# Patient Record
Sex: Female | Born: 1995 | Race: Black or African American | Hispanic: No | Marital: Single | Smoking: Former smoker
Health system: Southern US, Community
[De-identification: ages and names within clinical notes are randomized; demographics above are authoritative.]

## PROBLEM LIST (undated history)

## (undated) DIAGNOSIS — F909 Attention-deficit hyperactivity disorder, unspecified type: Secondary | ICD-10-CM

## (undated) DIAGNOSIS — F32A Depression, unspecified: Secondary | ICD-10-CM

## (undated) DIAGNOSIS — F329 Major depressive disorder, single episode, unspecified: Secondary | ICD-10-CM

## (undated) DIAGNOSIS — D649 Anemia, unspecified: Secondary | ICD-10-CM

## (undated) HISTORY — PX: WISDOM TOOTH EXTRACTION: SHX21

## (undated) HISTORY — PX: CHEST TUBE INSERTION: SHX231

## (undated) HISTORY — PX: HIP SURGERY: SHX245

---
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
## 2018-07-21 ENCOUNTER — Emergency Department (HOSPITAL_COMMUNITY)
Admission: EM | Admit: 2018-07-21 | Discharge: 2018-07-22 | Disposition: A | Discharge status: Home / Self Care | Payer: Self-pay | Attending: Emergency Medicine | Admitting: Emergency Medicine

## 2018-07-21 ENCOUNTER — Other Ambulatory Visit: Payer: Self-pay

## 2018-07-21 ENCOUNTER — Encounter (HOSPITAL_COMMUNITY): Payer: Self-pay | Admitting: Emergency Medicine

## 2018-07-21 DIAGNOSIS — Z79899 Other long term (current) drug therapy: Secondary | ICD-10-CM | POA: Insufficient documentation

## 2018-07-21 DIAGNOSIS — N939 Abnormal uterine and vaginal bleeding, unspecified: Secondary | ICD-10-CM | POA: Insufficient documentation

## 2018-07-21 DIAGNOSIS — B9689 Other specified bacterial agents as the cause of diseases classified elsewhere: Secondary | ICD-10-CM

## 2018-07-21 DIAGNOSIS — N760 Acute vaginitis: Secondary | ICD-10-CM | POA: Insufficient documentation

## 2018-07-21 LAB — BASIC METABOLIC PANEL
Anion gap: 9 (ref 5–15)
BUN: 12 mg/dL (ref 6–20)
CO2: 23 mmol/L (ref 22–32)
Calcium: 9.3 mg/dL (ref 8.9–10.3)
Chloride: 107 mmol/L (ref 98–111)
Creatinine, Ser: 0.83 mg/dL (ref 0.44–1.00)
GFR calc Af Amer: >60 mL/min (ref >60)
GFR calc non Af Amer: >60 mL/min (ref >60)
Glucose, Bld: 83 mg/dL (ref 70–99)
Potassium: 3.5 mmol/L (ref 3.5–5.1)
Sodium: 139 mmol/L (ref 135–145)

## 2018-07-21 LAB — CBC
HCT: 36.7 % (ref 36.0–46.0)
Hemoglobin: 11.4 g/dL — ABNORMAL LOW (ref 12.0–15.0)
MCH: 28 pg (ref 26.0–34.0)
MCHC: 31.1 g/dL (ref 30.0–36.0)
MCV: 90.2 fL (ref 80.0–100.0)
Platelets: 280 10*3/uL (ref 150–400)
RBC: 4.07 MIL/uL (ref 3.87–5.11)
RDW: 13.8 % (ref 11.5–15.5)
WBC: 10.8 10*3/uL — ABNORMAL HIGH (ref 4.0–10.5)
nRBC: 0 % (ref 0.0–0.2)

## 2018-07-21 LAB — WET PREP, GENITAL
Sperm: NONE SEEN
Trich, Wet Prep: NONE SEEN
Yeast Wet Prep HPF POC: NONE SEEN

## 2018-07-21 LAB — I-STAT BETA HCG BLOOD, ED (MC, WL, AP ONLY): I-stat hCG, quantitative: <5 m[IU]/mL (ref <5)

## 2018-07-21 NOTE — ED Triage Notes (Signed)
Pt reports she needs a pregnancy test, although she had a negative one at home.  Upon further inquiry she reports "pregnancy symptoms for two weeks" however has been bleeding for two weeks.

## 2018-07-21 NOTE — ED Provider Notes (Signed)
[facility] EMERGENCY DEPARTMENT Provider Note   CSN: [id number] Arrival date & time: 07/21/18  2017    History   Chief Complaint Chief Complaint  Patient presents with  . Vaginal Bleeding    HPI Wendy Smith is a 23 y.o. female.     HPI  Patient is a 23 year old female with past medical history of ADHD, depression, presenting for vaginal bleeding.  Patient reports that she has had a prolonged course of her menstrual period for approximately 1.5 weeks.  She reports that she is continued to have clots of blood past each day.  Patient reports that in the past when she was on Depo-Provera she spotted for long periods of time but did not have heavy bleeding.  She is no longer on any form of hormonal contraception.  Patient denies any dizziness, lightheadedness, presyncope, chest pain, or shortness of breath.  Patient reports mild pelvic cramping, but no significant pain.  Patient reports that there is a chance she could be pregnant, although did have a negative urine pregnancy test at home.  She has not had any missed menses. She denies any vaginal discharge.  Reports she is sexually active with one female partner, but denies STI concerns.  Past Medical History:  Diagnosis Date  . ADHD (attention deficit hyperactivity disorder)   . Depression     There are no active problems to display for this patient.   Past Surgical History:  Procedure Laterality Date  . WISDOM TOOTH EXTRACTION       OB History   No obstetric history on file.      Home Medications    Prior to Admission medications   Medication Sig Start Date End Date Taking? Authorizing Provider  amoxicillin (AMOXIL) 500 MG capsule Take 500 mg by mouth 3 (three) times daily.    [provider]  doxycycline (VIBRAMYCIN) 100 MG capsule Take 1 capsule (100 mg total) by mouth 2 (two) times daily. X 7 days 09/12/14   Tobey Grim, MD  FLUoxetine (PROZAC) 20 MG tablet Take 20 mg by mouth daily.     [provider]  lisdexamfetamine (VYVANSE) 30 MG capsule Take 30 mg by mouth daily.    [provider]  trimethoprim-polymyxin b (POLYTRIM) ophthalmic solution Place 1 drop into the right eye every 4 (four) hours. X 7 days 09/24/13   Presson, Mathis Fare, PA    Family History No family history on file.  Social History Social History   Tobacco Use  . Smoking status: Never Smoker  . Smokeless tobacco: Never Used  Substance Use Topics  . Alcohol use: No  . Drug use: No     Allergies   Patient has no known allergies.   Review of Systems Review of Systems  Constitutional: Negative for chills, fatigue and fever.  Respiratory: Negative for shortness of breath.   Cardiovascular: Negative for chest pain.  Gastrointestinal: Negative for abdominal pain, nausea and vomiting.  Genitourinary: Positive for menstrual problem and vaginal bleeding. Negative for flank pain, pelvic pain, vaginal discharge and vaginal pain.  Neurological: Negative for weakness and light-headedness.  All other systems reviewed and are negative.    Physical Exam Updated Vital Signs BP 114/67 (BP Location: Right Arm)   Pulse (!) 107   Temp 98.4 F (36.9 C) (Oral)   Resp 18   SpO2 95%   Physical Exam Vitals signs and nursing note reviewed.  Constitutional:      General: She is not in  acute distress.    Appearance: She is well-developed.  HENT:     Head: Normocephalic and atraumatic.     Mouth/Throat:     Mouth: Mucous membranes are moist.  Eyes:     Conjunctiva/sclera: Conjunctivae normal.     Pupils: Pupils are equal, round, and reactive to light.     Comments: No conjunctival pallor.  Neck:     Musculoskeletal: Normal range of motion and neck supple.  Cardiovascular:     Rate and Rhythm: Normal rate and regular rhythm.     Heart sounds: S1 normal and S2 normal. No murmur.  Pulmonary:     Effort: Pulmonary effort is normal.     Breath sounds: Normal breath sounds. No  wheezing or rales.  Abdominal:     General: There is no distension.     Palpations: Abdomen is soft.     Tenderness: There is no abdominal tenderness. There is no guarding.  Genitourinary:    Comments: Pelvic examination performed with RN chaperone present.  Patient has no lymphadenopathy of the inguinal region.  Vaginal tissue pink and rugated.  Cervix nonerythematous and nonfriable.  Cervix is closed. There is a clot of blood around the cervix, but no sources of active bleeding. Minimal discharge on exam. On bimanual exam, patient exhibits no cervical motion tenderness, tenderness of uterine fundus nor adnexal tenderness. Musculoskeletal: Normal range of motion.        General: No deformity.  Lymphadenopathy:     Cervical: No cervical adenopathy.  Skin:    General: Skin is warm and dry.     Findings: No erythema or rash.  Neurological:     Mental Status: She is alert.     Comments: Cranial nerves grossly intact. Patient moves extremities symmetrically and with good coordination.  Psychiatric:        Behavior: Behavior normal.        Thought Content: Thought content normal.        Judgment: Judgment normal.      ED Treatments / Results  Labs (all labs ordered are listed, but only abnormal results are displayed) Labs Reviewed  WET PREP, GENITAL - Abnormal; Notable for the following components:      Result Value   Clue Cells Wet Prep HPF POC PRESENT (*)    WBC, Wet Prep HPF POC MANY (*)    All other components within normal limits  CBC - Abnormal; Notable for the following components:   WBC 10.8 (*)    Hemoglobin 11.4 (*)    All other components within normal limits  URINALYSIS, ROUTINE W REFLEX MICROSCOPIC - Abnormal; Notable for the following components:   APPearance HAZY (*)    Hgb urine dipstick MODERATE (*)    Ketones, ur 5 (*)    Protein, ur 100 (*)    All other components within normal limits  BASIC METABOLIC PANEL  RPR  HIV ANTIBODY (ROUTINE TESTING W REFLEX)   I-STAT BETA HCG BLOOD, ED ([facility], [facility], [facility] ONLY)  GC/CHLAMYDIA PROBE AMP ([place]) NOT AT [facility]    EKG None  Radiology No results found.  Procedures Procedures (including critical care time)  Medications Ordered in ED Medications - No data to display   Initial Impression / Assessment and Plan / ED Course  I have reviewed the triage vital signs and the nursing notes.  Pertinent labs & imaging results that were available during my care of the patient were reviewed by me and considered in my medical decision making (  see chart for details).  Clinical Course as of Jul 22 11  Sun Jul 21, 2018  2331 Not significantly changed from prior.   Hemoglobin(!): 11.4 [AM]    Clinical Course User Index [AM] Elisha PonderMurray, Madell Heino B, PA-C       Patient is nontoxic-appearing and hemodynamically stable.  Patient without any rapid vaginal bleeding with concerns for acute blood loss anemia.  Do not suspect spontaneous abortion, as patient has close cervix.  She has not missed any menses.Source appears to be vaginal and not urinary.  Work-up demonstrating stable hemoglobin 11.4.  Wet prep demonstrating clue cells many WBCs, however this does not correlate with significant vaginal discharge. Will treat for BV with flagyl alone.    Given stable hemoglobin and examination without significant or rapid bleeding, will [facility] to [facility].  Will refrain from starting Megace, TXA, or other medical therapy to stop the bleeding.  Discussed structural and functional causes of abnormal uterine bleeding with the patient.  Recommended she follow-up with OB/GYN.  Patient was given return precautions for any sudden increase in bleeding or any signs or symptoms of acute blood loss anemia such as dizziness, lightheadedness, syncope or presyncope.  Patient is in understanding and agrees with plan of care.  Final Clinical Impressions(s) / ED Diagnoses   Final diagnoses:  Abnormal uterine  bleeding  BV (bacterial vaginosis)    ED Discharge Orders         Ordered    metroNIDAZOLE (FLAGYL) 500 MG tablet  2 times daily     07/22/18 0102           Delia ChimesMurray, Graeson Nouri B, PA-C 07/22/18 0117    Cathren LaineSteinl, Kevin, MD 07/22/18 1538

## 2018-07-22 LAB — URINALYSIS, ROUTINE W REFLEX MICROSCOPIC
Bacteria, UA: NONE SEEN
Bilirubin Urine: NEGATIVE
Glucose, UA: NEGATIVE mg/dL
Ketones, ur: 5 mg/dL — AB
Leukocytes,Ua: NEGATIVE
Nitrite: NEGATIVE
Protein, ur: 100 mg/dL — AB
Specific Gravity, Urine: 1.029 (ref 1.005–1.030)
pH: 5 (ref 5.0–8.0)

## 2018-07-22 LAB — GC/CHLAMYDIA PROBE AMP (~~LOC~~) NOT AT ARMC
Chlamydia: NEGATIVE
Neisseria Gonorrhea: NEGATIVE

## 2018-07-22 LAB — RPR: RPR Ser Ql: NONREACTIVE

## 2018-07-22 LAB — HIV ANTIBODY (ROUTINE TESTING W REFLEX): HIV Screen 4th Generation wRfx: NONREACTIVE

## 2018-07-22 MED ORDER — METRONIDAZOLE 500 MG PO TABS: 500.0000 mg | ORAL_TABLET | Freq: Two times a day (BID) | ORAL | 0 refills | Status: DC

## 2018-07-22 NOTE — ED Notes (Signed)
Patient verbalizes understanding of discharge instructions. Opportunity for questioning and answers were provided. Armband removed by staff, pt discharged from ED.  

## 2018-07-22 NOTE — Discharge Instructions (Signed)
Please see the information and instructions below regarding your visit.  Your diagnoses today include:  1. Abnormal uterine bleeding    There are many causes of abnormal uterine bleeding.  They are classified as either structural or functional causes of bleeding.  Tests performed today include: See side panel of your discharge paperwork for testing performed today. Vital signs are listed at the bottom of these instructions.   Pregnancy test is negative today.  Your hemoglobin is also stable.  We tested for gonorrhea, chlamydia, HIV and syphilis today.  These results will be available in 48 to 72 hours.  You can find them on your MyChart, or you will receive a call for any positive results.  You will not receive a call for any negative results.   Medications prescribed:    Take any prescribed medications only as prescribed, and any over the counter medications only as directed on the packaging.  You may take naproxen, 275 mg every 12 hours as needed for abdominal cramping.  Flagyl is an antibiotic used to treat Bacterial vaginosis. This medication CANNOT be taken with alcohol, because it can cause nausea and vomiting combined with alcohol. Please also refrain from drinking alcohol for 48 hours after you finish the medicine.  Home care instructions:  Please follow any educational materials contained in this packet.   Follow-up instructions: Please [facility] with [facility] from [facility].  Return instructions:  Please return to the Emergency Department if you experience worsening symptoms.  Please return the emergency department if you have a sudden increase in bleeding where you are soaking through pads or tampons much faster, you experience any signs or symptoms of acute blood loss such as dizziness, lightheadedness, feeling sweaty, having chest pain or shortness of breath, or feel very weak. Please return if you have any other emergent  concerns.  Additional Information:   Your vital signs today were: BP 105/65 (BP Location: Right Arm)    Pulse 71    Temp 98.4 F (36.9 C) (Oral)    Resp 18    SpO2 100%  If your blood pressure (BP) was elevated on multiple readings during this visit above 130 for the top number or above 80 for the bottom number, please have this repeated by your primary care provider within one month. --------------  Thank you for allowing [place] to participate in your care today.

## 2018-12-09 DIAGNOSIS — S32402A Unspecified fracture of left acetabulum, initial encounter for closed fracture: Secondary | ICD-10-CM | POA: Insufficient documentation

## 2019-06-24 ENCOUNTER — Other Ambulatory Visit: Payer: Self-pay

## 2019-06-24 ENCOUNTER — Emergency Department (HOSPITAL_COMMUNITY)
Admission: EM | Admit: 2019-06-24 | Discharge: 2019-06-24 | Disposition: A | Discharge status: Home / Self Care | Payer: Self-pay | Attending: Emergency Medicine | Admitting: Emergency Medicine

## 2019-06-24 DIAGNOSIS — X58XXXA Exposure to other specified factors, initial encounter: Secondary | ICD-10-CM | POA: Insufficient documentation

## 2019-06-24 DIAGNOSIS — Y999 Unspecified external cause status: Secondary | ICD-10-CM | POA: Insufficient documentation

## 2019-06-24 DIAGNOSIS — Y929 Unspecified place or not applicable: Secondary | ICD-10-CM | POA: Insufficient documentation

## 2019-06-24 DIAGNOSIS — Z79899 Other long term (current) drug therapy: Secondary | ICD-10-CM | POA: Insufficient documentation

## 2019-06-24 DIAGNOSIS — Y939 Activity, unspecified: Secondary | ICD-10-CM | POA: Insufficient documentation

## 2019-06-24 DIAGNOSIS — S01512A Laceration without foreign body of oral cavity, initial encounter: Secondary | ICD-10-CM | POA: Insufficient documentation

## 2019-06-24 DIAGNOSIS — F909 Attention-deficit hyperactivity disorder, unspecified type: Secondary | ICD-10-CM | POA: Insufficient documentation

## 2019-06-24 DIAGNOSIS — S0993XA Unspecified injury of face, initial encounter: Secondary | ICD-10-CM

## 2019-06-24 MED ORDER — AMOXICILLIN-POT CLAVULANATE 875-125 MG PO TABS: 1.0000 | ORAL_TABLET | Freq: Two times a day (BID) | ORAL | 0 refills | Status: DC

## 2019-06-24 NOTE — ED Provider Notes (Signed)
[facility] EMERGENCY DEPARTMENT Provider Note   CSN: [id number] Arrival date & time: 06/24/19  1522     History No chief complaint on file.   Wendy Smith is a 24 y.o. female who presents to the ED today with complaint of bleeding to tongue that began around 10 AM this morning. Pt reports she believes she may have bitten her tongue. She also  Mentions that she had a new tongue piercing placed 2 days ago however denies bleeding from the piercing site itself. Denies fevers, chills, or any other associated symptoms.  Pt is not anticoagulated.   The history is provided by the patient and medical records.       Past Medical History:  Diagnosis Date  . ADHD (attention deficit hyperactivity disorder)   . Depression     There are no problems to display for this patient.   Past Surgical History:  Procedure Laterality Date  . WISDOM TOOTH EXTRACTION       OB History   No obstetric history on file.     No family history on file.  Social History   Tobacco Use  . Smoking status: Never Smoker  . Smokeless tobacco: Never Used  Substance Use Topics  . Alcohol use: No  . Drug use: No    Home Medications Prior to Admission medications   Medication Sig Start Date End Date Taking? Authorizing Provider  amoxicillin-clavulanate (AUGMENTIN) 875-125 MG tablet Take 1 tablet by mouth every 12 (twelve) hours. 06/24/19   Alroy Bailiff, Arohi Salvatierra, PA-C  FLUoxetine (PROZAC) 20 MG tablet Take 20 mg by mouth daily.    [provider]  lisdexamfetamine (VYVANSE) 30 MG capsule Take 30 mg by mouth daily.    [provider]  metroNIDAZOLE (FLAGYL) 500 MG tablet Take 1 tablet (500 mg total) by mouth 2 (two) times daily. 07/22/18   Langston Masker B, PA-C  trimethoprim-polymyxin b (POLYTRIM) ophthalmic solution Place 1 drop into the right eye every 4 (four) hours. X 7 days 09/24/13   Lutricia Feil, PA    Allergies    Patient has no known allergies.  Review of  Systems   Review of Systems  Constitutional: Negative for chills and fever.  HENT: Positive for mouth sores (bleeding from tongue).     Physical Exam Updated Vital Signs BP 135/71 (BP Location: Right Arm)   Pulse 99   Temp 98.3 F (36.8 C) (Oral)   Resp 16   LMP 04/24/2019 (Approximate)   SpO2 100%   Physical Exam Vitals and nursing note reviewed.  Constitutional:      Appearance: She is not ill-appearing or diaphoretic.  HENT:     Head: Normocephalic.     Comments: Tongue piercing removed from tongue. Pt has small area on left tip of tongue with missing tissue and small capillary that continues to bleed.     Right Ear: Tympanic membrane normal.     Left Ear: Tympanic membrane normal.  Eyes:     Conjunctiva/sclera: Conjunctivae normal.  Cardiovascular:     Rate and Rhythm: Normal rate and regular rhythm.     Pulses: Normal pulses.  Pulmonary:     Effort: Pulmonary effort is normal.     Breath sounds: Normal breath sounds. No wheezing, rhonchi or rales.  Skin:    General: Skin is warm and dry.     Coloration: Skin is not jaundiced.  Neurological:     Mental Status: She is alert.     ED  Results / Procedures / Treatments   Labs (all labs ordered are listed, but only abnormal results are displayed) Labs Reviewed - No data to display  EKG None  Radiology No results found.  Procedures Procedures (including critical care time)  Medications Ordered in ED Medications - No data to display  ED Course  I have reviewed the triage vital signs and the nursing notes.  Pertinent labs & imaging results that were available during my care of the patient were reviewed by me and considered in my medical decision making (see chart for details).    MDM Rules/Calculators/A&P                      24 year old female who presents to the ED today with bleeding from tongue.  Believes she may have bit it.  Did recently have a tongue piercing placed 2 days ago.  On arrival to the  ED patient is afebrile, nontachycardic and nontachypneic.  She appears to be in no acute distress.  On exam she has a small piece of missing tissue on the edge of her tongue on the left side consistent with likely bite.  She has small capillary that continues to bleed.  Will have patient hold pressure and continue to reevaluate.  Is not anticoagulated.  After holding pressure for approximately 40 minutes bleeding has ceased.  Patient to be discharged home at this time.  Will cover empirically with antibiotic coverage given patient likely bit part of her tongue causing the bleeding.  Patient to follow-up with her PCP.  Advised to return to the ED if bleeding recurs and she is unable to stop bleeding after direct pressure for greater than 30 minutes.  Patient is in agreement with plan and stable for discharge home.   This note was prepared using Dragon voice recognition software and may include unintentional dictation errors due to the inherent limitations of voice recognition software.  Final Clinical Impression(s) / ED Diagnoses Final diagnoses:  Injury of tongue, initial encounter    Rx / DC Orders ED Discharge Orders         Ordered    amoxicillin-clavulanate (AUGMENTIN) 875-125 MG tablet  Every 12 hours     06/24/19 1713           Discharge Instructions     Please pick up antibiotics and take as prescribed to cover for infection  If bleeding reoccurs please hold pressure for 30 minutes to stop the bleeding If you are unable to stop the bleeding again despite holding pressure for a prolonged period of time please return to the ED for reevaluation I would not recommend putting tongue piercing back in until this wound heals        Tanda Rockers, PA-C 06/24/19 1715    Eber Hong, MD 06/25/19 1204

## 2019-06-24 NOTE — Discharge Instructions (Signed)
Please pick up antibiotics and take as prescribed to cover for infection  If bleeding reoccurs please hold pressure for 30 minutes to stop the bleeding If you are unable to stop the bleeding again despite holding pressure for a prolonged period of time please return to the ED for reevaluation I would not recommend putting tongue piercing back in until this wound heals

## 2019-06-24 NOTE — ED Triage Notes (Signed)
Pt got her tongue pierced two days ago and woke up this morning with bleeding from site. Denies pain.

## 2019-06-24 NOTE — ED Notes (Signed)
Pt verbalizes understanding of d/c instructions. Prescriptions reviewed with patient. Pt ambulatory at d/c with all belongings.  

## 2019-06-24 NOTE — ED Notes (Signed)
ED Provider at bedside. 

## 2019-08-25 ENCOUNTER — Other Ambulatory Visit: Payer: Self-pay

## 2019-08-25 ENCOUNTER — Ambulatory Visit (HOSPITAL_COMMUNITY)
Admission: EM | Admit: 2019-08-25 | Discharge: 2019-08-25 | Disposition: A | Discharge status: Home / Self Care | Payer: Self-pay | Attending: Family Medicine | Admitting: Family Medicine

## 2019-08-25 ENCOUNTER — Encounter (HOSPITAL_COMMUNITY): Payer: Self-pay

## 2019-08-25 DIAGNOSIS — N912 Amenorrhea, unspecified: Secondary | ICD-10-CM

## 2019-08-25 DIAGNOSIS — F909 Attention-deficit hyperactivity disorder, unspecified type: Secondary | ICD-10-CM | POA: Insufficient documentation

## 2019-08-25 DIAGNOSIS — Z3201 Encounter for pregnancy test, result positive: Secondary | ICD-10-CM

## 2019-08-25 DIAGNOSIS — R350 Frequency of micturition: Secondary | ICD-10-CM

## 2019-08-25 DIAGNOSIS — Z79899 Other long term (current) drug therapy: Secondary | ICD-10-CM | POA: Insufficient documentation

## 2019-08-25 DIAGNOSIS — F329 Major depressive disorder, single episode, unspecified: Secondary | ICD-10-CM | POA: Insufficient documentation

## 2019-08-25 DIAGNOSIS — R3915 Urgency of urination: Secondary | ICD-10-CM

## 2019-08-25 LAB — POCT URINALYSIS DIP (DEVICE)
Glucose, UA: 100 mg/dL — AB
Hgb urine dipstick: NEGATIVE
Nitrite: NEGATIVE
Protein, ur: NEGATIVE mg/dL
Specific Gravity, Urine: >=1.03 (ref 1.005–1.030)
Urobilinogen, UA: 0.2 mg/dL (ref 0.0–1.0)
pH: 6 (ref 5.0–8.0)

## 2019-08-25 LAB — POC URINE PREG, ED: Preg Test, Ur: POSITIVE — AB

## 2019-08-25 NOTE — ED Provider Notes (Signed)
[facility]   CC: UTI  SUBJECTIVE:  Wendy Smith is a 24 y.o. female who complains of urinary frequency, urgency, mild nausea, low abd pain and low back pain. LMP 06/28/19. Patient denies a precipitating event, recent sexual encounter, excessive caffeine intake. Is sexually active with one female partner. Localizes the pain to the lower abdomen. Pain is intermittent and describes it as sharp. Has not tried any medications at home. Denies similar symptoms in the past.  Denies fever, chills, vomiting, flank pain, abnormal vaginal discharge or bleeding, hematuria.    ROS: As in HPI.  All other pertinent ROS negative.     Past Medical History:  Diagnosis Date  . ADHD (attention deficit hyperactivity disorder)   . Depression    Past Surgical History:  Procedure Laterality Date  . CHEST TUBE INSERTION    . HIP SURGERY Left   . WISDOM TOOTH EXTRACTION     No Known Allergies No current facility-administered medications on file prior to encounter.   Current Outpatient Medications on File Prior to Encounter  Medication Sig Dispense Refill  . amoxicillin-clavulanate (AUGMENTIN) 875-125 MG tablet Take 1 tablet by mouth every 12 (twelve) hours. 14 tablet 0  . FLUoxetine (PROZAC) 20 MG tablet Take 20 mg by mouth daily.    Marland Kitchen lisdexamfetamine (VYVANSE) 30 MG capsule Take 30 mg by mouth daily.    . metroNIDAZOLE (FLAGYL) 500 MG tablet Take 1 tablet (500 mg total) by mouth 2 (two) times daily. 14 tablet 0  . trimethoprim-polymyxin b (POLYTRIM) ophthalmic solution Place 1 drop into the right eye every 4 (four) hours. X 7 days 10 mL 0   Social History   Socioeconomic History  . Marital status: Single    Spouse name: Not on file  . Number of children: Not on file  . Years of education: Not on file  . Highest education level: Not on file  Occupational History  . Not on file  Tobacco Use  . Smoking status: Never Smoker  . Smokeless tobacco: Never Used  Substance and Sexual Activity  .  Alcohol use: No  . Drug use: No  . Sexual activity: Yes    Birth control/protection: Condom, Injection  Other Topics Concern  . Not on file  Social History Narrative  . Not on file   Social Determinants of Health   Financial Resource Strain:   . Difficulty of Paying Living Expenses:   Food Insecurity:   . Worried About Charity fundraiser in the Last Year:   . Arboriculturist in the Last Year:   Transportation Needs:   . Film/video editor (Medical):   Marland Kitchen Lack of Transportation (Non-Medical):   Physical Activity:   . Days of Exercise per Week:   . Minutes of Exercise per Session:   Stress:   . Feeling of Stress :   Social Connections:   . Frequency of Communication with Friends and Family:   . Frequency of Social Gatherings with Friends and Family:   . Attends Religious Services:   . Active Member of Clubs or Organizations:   . Attends Archivist Meetings:   Marland Kitchen Marital Status:   Intimate Partner Violence:   . Fear of Current or Ex-Partner:   . Emotionally Abused:   Marland Kitchen Physically Abused:   . Sexually Abused:    No family history on file.  OBJECTIVE:  Vitals:   08/25/19 1743 08/25/19 1745  BP: (!) 124/55   Pulse: 91  Resp: 16   Temp: 98.4 F (36.9 C)   TempSrc: Oral   SpO2: 100%   Weight:  172 lb (78 kg)  Height:  5\' 8"  (1.727 m)   General appearance: AOx3 in no acute distress HEENT: NCAT.  Oropharynx clear.  Lungs: clear to auscultation bilaterally without adventitious breath sounds Heart: regular rate and rhythm.  Radial pulses 2+ symmetrical bilaterally Abdomen: soft; non-distended; no tenderness; bowel sounds present; no guarding or rebound tenderness Back: no CVA tenderness Extremities: no edema; symmetrical with no gross deformities Skin: warm and dry Neurologic: Ambulates from chair to exam table without difficulty Psychological: alert and cooperative; normal mood and affect  Labs Reviewed  POCT URINALYSIS DIP (DEVICE) - Abnormal;  Notable for the following components:      Result Value   Glucose, UA 100 (*)    Bilirubin Urine SMALL (*)    Ketones, ur TRACE (*)    Leukocytes,Ua SMALL (*)    All other components within normal limits  POC URINE PREG, ED - Abnormal; Notable for the following components:   Preg Test, Ur POSITIVE (*)    All other components within normal limits  URINE CULTURE    ASSESSMENT & PLAN:  1. Positive pregnancy test   2. Amenorrhea   3. Urinary frequency   4. Urinary urgency     No orders of the defined types were placed in this encounter.  Positive Pregnancy Test Follow up for prenatal care with [facility]  Urinary Frequency Urine culture sent.  We will call you with abnormal results that need further treatment.   Push fluids and get plenty of rest.   Take antibiotic as directed and to completion Take pyridium as prescribed and as needed for symptomatic relief Follow up with PCP if symptoms persists Return here or go to ER if you have any new or worsening symptoms such as fever, worsening abdominal pain, nausea/vomiting, flank pain  Outlined signs and symptoms indicating need for more acute intervention. Patient verbalized understanding. After Visit Summary given.      [facility], NP 08/25/19 1847

## 2019-08-25 NOTE — Discharge Instructions (Signed)
You had a positive pregnancy test in the office today  You may have a urinary tract infection. We are going to culture your urine and will call you as soon as we have the results.   Drink plenty of water, 8-10 glasses per day.   Follow up with your primary care provider as needed.   Go to the Emergency Department if you experience severe pain, shortness of breath, high fever, or other concerns.

## 2019-08-25 NOTE — ED Triage Notes (Signed)
Wants pregnancy test. Pt c/o nausea, abdominal pain, lower back pain. LMP 06/28/2019

## 2019-08-27 LAB — URINE CULTURE

## 2019-09-01 ENCOUNTER — Ambulatory Visit (INDEPENDENT_AMBULATORY_CARE_PROVIDER_SITE_OTHER): Payer: Self-pay | Admitting: *Deleted

## 2019-09-01 DIAGNOSIS — Z3400 Encounter for supervision of normal first pregnancy, unspecified trimester: Secondary | ICD-10-CM

## 2019-09-01 DIAGNOSIS — Z3480 Encounter for supervision of other normal pregnancy, unspecified trimester: Secondary | ICD-10-CM | POA: Insufficient documentation

## 2019-09-01 NOTE — Progress Notes (Signed)
  Virtual Visit via Telephone Note  I connected with [name] on 09/01/19 at  2:20 PM EDT by telephone and verified that I am speaking with the correct person using two identifiers.  Location: [facility] Patient: Wendy Smith, MRN: [id number] Provider: Clovis Pu, RN   I discussed the limitations, risks, security and privacy concerns of performing an evaluation and management service by telephone and the availability of in person appointments. I also discussed with the patient that there may be a patient responsible charge related to this service. The patient expressed understanding and agreed to proceed.   History of Present Illness: PRENATAL INTAKE SUMMARY  Ms. Newborn presents today New OB Nurse Interview.  OB History    Gravida  1   Para      Term      Preterm      AB      Living        SAB      TAB      Ectopic      Multiple      Live Births             I have reviewed the patient's medical, obstetrical, social, and family histories, medications, and available lab results.  SUBJECTIVE She has no unusual complaints   Observations/Objective: Initial nurse interview for history/labs (New OB)  EDD: 04/05/20 by LMP GA: [redacted]w[redacted]d G1P0 FHT: non face to face interview  GENERAL APPEARANCE: non face to face interview  Assessment and Plan: Normal pregnancy Prenatal care-[facility] Lab work to completed at next visit with Judeth Horn, NP 09/24/2019 Sign up for Babyscipts and Mychart Continue PNV Waiting for medicaid to be active and will send Rx for Blood pressure monitor and weight scale  Follow Up Instructions:   I discussed the assessment and treatment plan with the patient. The patient was provided an opportunity to ask questions and all were answered. The patient agreed with the plan and demonstrated an understanding of the instructions.   The patient was advised to call back or seek an in-person evaluation if the symptoms worsen or if the  condition fails to improve as anticipated.  I provided 15 minutes of non-face-to-face time during this encounter.   Clovis Pu, RN

## 2019-09-24 ENCOUNTER — Encounter: Payer: Self-pay | Admitting: Student

## 2019-09-24 ENCOUNTER — Ambulatory Visit (INDEPENDENT_AMBULATORY_CARE_PROVIDER_SITE_OTHER): Payer: Self-pay | Admitting: Student

## 2019-09-24 ENCOUNTER — Other Ambulatory Visit: Payer: Self-pay

## 2019-09-24 ENCOUNTER — Other Ambulatory Visit (HOSPITAL_COMMUNITY)
Admission: RE | Admit: 2019-09-24 | Discharge: 2019-09-24 | Disposition: A | Discharge status: Home / Self Care | Payer: Medicaid Other | Source: Ambulatory Visit | Attending: Student | Admitting: Student

## 2019-09-24 ENCOUNTER — Encounter: Payer: Self-pay | Admitting: General Practice

## 2019-09-24 VITALS — BP 115/72 | HR 89 | Temp 98.3°F | Wt 155.4 lb

## 2019-09-24 DIAGNOSIS — Z3A12 12 weeks gestation of pregnancy: Secondary | ICD-10-CM

## 2019-09-24 DIAGNOSIS — Z3481 Encounter for supervision of other normal pregnancy, first trimester: Secondary | ICD-10-CM

## 2019-09-24 DIAGNOSIS — A5901 Trichomonal vulvovaginitis: Secondary | ICD-10-CM

## 2019-09-24 DIAGNOSIS — Z3400 Encounter for supervision of normal first pregnancy, unspecified trimester: Secondary | ICD-10-CM | POA: Diagnosis present

## 2019-09-24 NOTE — Patient Instructions (Addendum)
Constipation, Adult Constipation is when a person has fewer bowel movements in a week than normal, has difficulty having a bowel movement, or has stools that are dry, hard, or larger than normal. Constipation may be caused by an underlying condition. It may become worse with age if a person takes certain medicines and does not take in enough fluids. Follow these instructions at home: Eating and drinking   Eat foods that have a lot of fiber, such as fresh fruits and vegetables, whole grains, and beans.  Limit foods that are high in fat, low in fiber, or overly processed, such as french fries, hamburgers, cookies, candies, and soda.  Drink enough fluid to keep your urine clear or pale yellow. General instructions  Exercise regularly or as told by your health care provider.  Go to the restroom when you have the urge to go. Do not hold it in.  Take over-the-counter and prescription medicines only as told by your health care provider. These include any fiber supplements.  Practice pelvic floor retraining exercises, such as deep breathing while relaxing the lower abdomen and pelvic floor relaxation during bowel movements.  Watch your condition for any changes.  Keep all follow-up visits as told by your health care provider. This is important. Contact a health care provider if:  You have pain that gets worse.  You have a fever.  You do not have a bowel movement after 4 days.  You vomit.  You are not hungry.  You lose weight.  You are bleeding from the anus.  You have thin, pencil-like stools. Get help right away if:  You have a fever and your symptoms suddenly get worse.  You leak stool or have blood in your stool.  Your abdomen is bloated.  You have severe pain in your abdomen.  You feel dizzy or you faint. This information is not intended to replace advice given to you by your health care provider. Make sure you discuss any questions you have with your health care  provider. Document Revised: 02/23/2017 Document Reviewed: 09/01/2015 Elsevier Patient Education  2020 [employer].    Childbirth Education Options:     [email or web address] [facility] Department Classes:  Childbirth education classes can help you get ready for a positive parenting experience. You can also meet other expectant parents and get free stuff for your baby. Each class runs for five weeks on the same night and costs $45 for the mother-to-be and her support person. Medicaid covers the cost if you are eligible. Call [phone] to register. [facility] Childbirth Education:  [phone] or [phone] or [email or web address]  Baby & Me Class: Discuss newborn & infant parenting and family adjustment issues with other new mothers in a relaxed environment. Each week brings a new speaker or baby-centered activity. We encourage new mothers to join [place] every Thursday at 11:00am. Babies birth until crawling. No registration or fee. Daddy [employer]: This course offers Dads-to-be the tools and knowledge needed to feel confident on their journey to becoming new fathers. Experienced dads, who have been trained as coaches, teach dads-to-be how to hold, comfort, diaper, swaddle and play with their infant while being able to support the new mom as well. A class for men taught by men. $25/dad Big Brother/Big Sister: Let your children share in the joy of a new brother or sister in this special class designed just for them. Class includes discussion about how families care for babies: swaddling, holding, diapering, safety as well as how they can be  helpful in their new role. This class is designed for children ages 2 to 80, but any age is welcome. Please register each child individually. $5/child  Mom Talk: This mom-led group offers support and connection to mothers as they journey through the adjustments and struggles of that sometimes overwhelming first year after the birth of a  child. Tuesdays at 10:00am and Thursdays at 6:00pm. Babies welcome. No registration or fee. Breastfeeding Support Group: This group is a mother-to-mother support circle where moms have the opportunity to share their breastfeeding experiences. A Lactation Consultant is present for questions and concerns. Meets each Tuesday at 11:00am. No fee or registration. Breastfeeding Your Baby: Learn what to expect in the first days of breastfeeding your newborn.  This class will help you feel more confident with the skills needed to begin your breastfeeding experience. Many new mothers are concerned about breastfeeding after leaving the hospital. This class will also address the most common fears and challenges about breastfeeding during the first few weeks, months and beyond. (call for fee) Comfort Techniques and Tour: This 2 hour interactive class will provide you the opportunity to learn & practice hands-on techniques that can help relieve some of the discomfort of labor and encourage your baby to rotate toward the best position for birth. You and your partner will be able to try a variety of labor positions with birth balls and rebozos as well as practice breathing, relaxation, and visualization techniques. A tour of the [facility] is included with this class. $20 per registrant and support person Childbirth Class- Weekend Option: This class is a Weekend version of our Birth & Baby series. It is designed for parents who have a difficult time fitting several weeks of classes into their schedule. It covers the care of your newborn and the basics of labor and childbirth. It also includes a Maternity Care Center Tour of [facility] and lunch. The class is held two consecutive days: beginning on Friday evening from 6:30 - 8:30 p.m. and the next day, Saturday from 9 a.m. - 4 p.m. (call for fee) Linden Dolin Class: Interested in a waterbirth?  This informational class will help you discover  whether waterbirth is the right fit for you. Education about waterbirth itself, supplies you would need and how to assemble your support team is what you can expect from this class. Some obstetrical practices require this class in order to pursue a waterbirth. (Not all obstetrical practices offer waterbirth-check with your healthcare provider.) Register only the expectant mom, but you are encouraged to bring your partner to class! Required if planning waterbirth, no fee. Infant/Child CPR: Parents, grandparents, babysitters, and friends learn Cardio-Pulmonary Resuscitation skills for infants and children. You will also learn how to treat both conscious and unconscious choking in infants and children. This Family & Friends program does not offer certification. Register each participant individually to ensure that enough mannequins are available. (Call for fee) Grandparent Love: Expecting a grandbaby? This class is for you! Learn about the latest infant care and safety recommendations and ways to support your own child as he or she transitions into the parenting role. Taught by Registered Nurses who are childbirth instructors, but most importantly...they are grandmothers too! $10/person. Childbirth Class- Natural Childbirth: This series of 5 weekly classes is for expectant parents who want to learn and practice natural methods of coping with the process of labor and childbirth. Relaxation, breathing, massage, visualization, role of the partner, and helpful positioning are highlighted. Participants learn how to  be confident in their body's ability to give birth. This class will empower and help parents make informed decisions about their own care. Includes discussion that will help new parents transition into the immediate postpartum period. Maternity Care Center Tour of [facility] is included. We suggest taking this class between 25-32 weeks, but it's only a recommendation. $75 per registrant and one support  person or $30 Medicaid. Childbirth Class- 3 week Series: This option of 3 weekly classes helps you and your labor partner prepare for childbirth. Newborn care, labor & birth, cesarean birth, pain management, and comfort techniques are discussed and a Maternity Care Center Tour of [facility] is included. The class meets at the same time, on the same day of the week for 3 consecutive weeks beginning with the starting date you choose. $60 for registrant and one support person.  Marvelous Multiples: Expecting twins, triplets, or more? This class covers the differences in labor, birth, parenting, and breastfeeding issues that face multiples' parents. NICU tour is included. Led by a Certified Childbirth Educator who is the mother of twins. No fee. Caring for Baby: This class is for expectant and adoptive parents who want to learn and practice the most up-to-date newborn care for their babies. Focus is on birth through the first six weeks of life. Topics include feeding, bathing, diapering, crying, umbilical cord care, circumcision care and safe sleep. Parents learn to recognize symptoms of illness and when to call the pediatrician. Register only the mom-to-be and your partner or support person can plan to come with you! $10 per registrant and support person Childbirth Class- online option: This online class offers you the freedom to complete a Birth and Baby series in the comfort of your own home. The flexibility of this option allows you to review sections at your own pace, at times convenient to you and your support people. It includes additional video information, animations, quizzes, and extended activities. Get organized with helpful eClass tools, checklists, and trackers. Once you register online for the class, you will receive an email within a few days to accept the invitation and begin the class when the time is right for you. The content will be available to you for 60 days. $60 for 60 days of online  access for you and your support people.        The Maternity Assessment Unit (MAU) is located at the [facility] and Children's Center at [facility]. The address is: [address] [place], [place], [place] [address]. Please see map below for additional directions.    The Maternity Assessment Unit is designed to help you during your pregnancy, and for up to 6 weeks after delivery, with any pregnancy- or postpartum-related emergencies, if you think you are in labor, or if your water has broken. For example, if you experience nausea and vomiting, vaginal bleeding, severe abdominal or pelvic pain, elevated blood pressure or other problems related to your pregnancy or postpartum time, please come to the Maternity Assessment Unit for assistance.
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
## 2019-09-25 LAB — CBC/D/PLT+RPR+RH+ABO+RUB AB...
Antibody Screen: NEGATIVE
Basophils Absolute: 0 10*3/uL (ref 0.0–0.2)
Basos: 0 %
EOS (ABSOLUTE): 0.1 10*3/uL (ref 0.0–0.4)
Eos: 1 %
HCV Ab: <0.1 s/co ratio (ref 0.0–0.9)
HIV Screen 4th Generation wRfx: NONREACTIVE
Hematocrit: 32.9 % — ABNORMAL LOW (ref 34.0–46.6)
Hemoglobin: 10.4 g/dL — ABNORMAL LOW (ref 11.1–15.9)
Hepatitis B Surface Ag: NEGATIVE
Immature Grans (Abs): 0 10*3/uL (ref 0.0–0.1)
Immature Granulocytes: 0 %
Lymphocytes Absolute: 2.3 10*3/uL (ref 0.7–3.1)
Lymphs: 27 %
MCH: 28.3 pg (ref 26.6–33.0)
MCHC: 31.6 g/dL (ref 31.5–35.7)
MCV: 89 fL (ref 79–97)
Monocytes Absolute: 0.7 10*3/uL (ref 0.1–0.9)
Monocytes: 9 %
Neutrophils Absolute: 5.3 10*3/uL (ref 1.4–7.0)
Neutrophils: 63 %
Platelets: 320 10*3/uL (ref 150–450)
RBC: 3.68 x10E6/uL — ABNORMAL LOW (ref 3.77–5.28)
RDW: 13.2 % (ref 11.7–15.4)
RPR Ser Ql: NONREACTIVE
Rh Factor: NEGATIVE
Rubella Antibodies, IGG: 2.57 index (ref >0.99)
WBC: 8.5 10*3/uL (ref 3.4–10.8)

## 2019-09-25 LAB — CERVICOVAGINAL ANCILLARY ONLY
Bacterial Vaginitis (gardnerella): POSITIVE — AB
Candida Glabrata: NEGATIVE
Candida Vaginitis: NEGATIVE
Chlamydia: NEGATIVE
Comment: NEGATIVE
Comment: NEGATIVE
Comment: NEGATIVE
Comment: NEGATIVE
Comment: NEGATIVE
Comment: NORMAL
Neisseria Gonorrhea: NEGATIVE
Trichomonas: POSITIVE — AB

## 2019-09-25 LAB — HCV INTERPRETATION

## 2019-09-26 ENCOUNTER — Telehealth: Payer: Self-pay | Admitting: Certified Nurse Midwife

## 2019-09-26 DIAGNOSIS — A5901 Trichomonal vulvovaginitis: Secondary | ICD-10-CM | POA: Insufficient documentation

## 2019-09-26 LAB — URINE CULTURE, OB REFLEX

## 2019-09-26 LAB — CULTURE, OB URINE

## 2019-09-26 NOTE — Telephone Encounter (Signed)
Patient called. Family member answered phone and stated that Wendy Smith was not at home. Discussed with family member to have patient call this number back. Verbalizes understanding.   Sharyon Cable, CNM 09/26/19, 5:56 PM

## 2019-09-30 ENCOUNTER — Other Ambulatory Visit: Payer: Self-pay | Admitting: Certified Nurse Midwife

## 2019-09-30 DIAGNOSIS — A5901 Trichomonal vulvovaginitis: Secondary | ICD-10-CM

## 2019-09-30 MED ORDER — METRONIDAZOLE 500 MG PO TABS: 2000.0000 mg | ORAL_TABLET | Freq: Once | ORAL | 0 refills | Status: DC

## 2019-10-01 ENCOUNTER — Telehealth: Payer: Self-pay | Admitting: *Deleted

## 2019-10-01 NOTE — Telephone Encounter (Signed)
-----   Message from Sharyon Cable, CNM sent at 09/30/2019  1:51 PM EDT ----- Please call patient and notify of results. I have called patient multiple times and unable to reach patient.   I have sent in prescription to pharmacy for tx.   Steward Drone CNM

## 2019-10-01 NOTE — Telephone Encounter (Signed)
Left voice message with a guy for patient to return nurse call. Clovis Pu, RN

## 2019-10-02 ENCOUNTER — Encounter: Payer: Self-pay | Admitting: Student

## 2019-10-02 DIAGNOSIS — Z6791 Unspecified blood type, Rh negative: Secondary | ICD-10-CM | POA: Insufficient documentation

## 2019-10-13 NOTE — Telephone Encounter (Signed)
Called [facility] to see if patient picked up medication for treatment of Trich. Patient did not pick up medication.  Clovis Pu, RN

## 2019-10-13 NOTE — Telephone Encounter (Signed)
Left voice message for patient to return nurse call regarding results.  Latash Nouri L, RN  

## 2019-10-23 ENCOUNTER — Encounter: Payer: Self-pay | Admitting: Obstetrics and Gynecology

## 2019-10-30 ENCOUNTER — Ambulatory Visit (INDEPENDENT_AMBULATORY_CARE_PROVIDER_SITE_OTHER): Payer: Medicaid Other | Admitting: Obstetrics and Gynecology

## 2019-10-30 ENCOUNTER — Encounter: Payer: Medicaid Other | Admitting: Obstetrics and Gynecology

## 2019-10-30 ENCOUNTER — Encounter: Payer: Self-pay | Admitting: Obstetrics and Gynecology

## 2019-10-30 ENCOUNTER — Other Ambulatory Visit: Payer: Self-pay

## 2019-10-30 VITALS — BP 95/55 | HR 96 | Temp 97.9°F | Wt 151.8 lb

## 2019-10-30 DIAGNOSIS — A5901 Trichomonal vulvovaginitis: Secondary | ICD-10-CM

## 2019-10-30 DIAGNOSIS — Z3480 Encounter for supervision of other normal pregnancy, unspecified trimester: Secondary | ICD-10-CM

## 2019-10-30 DIAGNOSIS — O23591 Infection of other part of genital tract in pregnancy, first trimester: Secondary | ICD-10-CM

## 2019-10-30 MED ORDER — METRONIDAZOLE 500 MG PO TABS: 2000.0000 mg | ORAL_TABLET | Freq: Once | ORAL | 1 refills | Status: AC

## 2019-10-30 NOTE — Progress Notes (Signed)
   LOW-RISK PREGNANCY OFFICE VISIT Patient name: Wendy Smith MRN [id number]  Date of birth: [birth year] Chief Complaint:   Routine Prenatal Visit  History of Present Illness:   Wendy Smith is a 24 y.o. G32P0010 female at [redacted]w[redacted]d with an Estimated Date of Delivery: 04/05/20 being seen today for ongoing management of a low-risk pregnancy.  Today she reports recent negative COVID results. FOB present coughing, sneezing and nose running.. Contractions: Not present. Vag. Bleeding: None.  Movement: Present. denies leaking of fluid. Review of Systems:   Pertinent items are noted in HPI Denies abnormal vaginal discharge w/ itching/odor/irritation, headaches, visual changes, shortness of breath, chest pain, abdominal pain, severe nausea/vomiting, or problems with urination or bowel movements unless otherwise stated above. Pertinent History Reviewed:  Reviewed past medical,surgical, social, obstetrical and family history.  Reviewed problem list, medications and allergies. Physical Assessment:   Vitals:   10/30/19 1136  BP: (!) 95/55  Pulse: 96  Temp: 97.9 F (36.6 C)  Weight: 151 lb 12.8 oz (68.9 kg)  Body mass index is 23.08 kg/m.        Physical Examination:   General appearance: Well appearing, and in no distress  Mental status: Alert, oriented to person, place, and time  Skin: Warm & dry  Cardiovascular: Normal heart rate noted  Respiratory: Normal respiratory effort, no distress  Abdomen: Soft, gravid, nontender  Pelvic: Cervical exam deferred         Extremities: Edema: None  Fetal Status: Fetal Heart Rate (bpm): 145 Fundal Height: 17 cm Movement: Present    No results found for this or any previous visit (from the past 24 hour(s)).  Assessment & Plan:  1) Low-risk pregnancy G2P0010 at [redacted]w[redacted]d with an Estimated Date of Delivery: 04/05/20   2) Encounter for supervision of other normal pregnancy in second trimester - S=D - Advised that pregnancy is progressing normally  3)  Trichomonal vaginitis during pregnancy in first trimester  - Rx for metroNIDAZOLE (FLAGYL) 500 MG tablet x 4 with 1 refill for partner treatment - Return to office for TOC in 4 wks     Meds:  Meds ordered this encounter  Medications  . metroNIDAZOLE (FLAGYL) 500 MG tablet    Sig: Take 4 tablets (2,000 mg total) by mouth once for 1 dose.    Dispense:  4 tablet    Refill:  1   Labs/procedures today: none  Plan:  Continue routine obstetrical care   Reviewed: Preterm labor symptoms and general obstetric precautions including but not limited to vaginal bleeding, contractions, leaking of fluid and fetal movement were reviewed in detail with the patient.  All questions were answered.   Follow-up: Return in about 4 weeks (around 11/27/2019) for Return OB visit for TOC.  No orders of the defined types were placed in this encounter.  Raelyn Mora MSN, CNM 10/30/2019

## 2019-10-31 ENCOUNTER — Telehealth: Payer: Self-pay | Admitting: Physician Assistant

## 2019-10-31 NOTE — Telephone Encounter (Signed)
   DUANE TRIAS DOB: 07-12-1995 MRN: [id number]   RIDER WAIVER AND RELEASE OF LIABILITY  For purposes of improving physical access to our facilities, [facility] is pleased to partner with third parties to provide [facility] patients or other authorized individuals the option of convenient, on-demand ground transportation services (the Chiropractor") through use of the technology service that enables users to request on-demand ground transportation from independent third-party providers.  By opting to use and accept these [employer], I, the undersigned, hereby agree on behalf of myself, and on behalf of any minor child using the [employer] for whom I am the parent or legal guardian, as follows:  1. Science writer provided to me are provided by independent third-party transportation providers who are not [employer] or employees and who are unaffiliated with [employer]. 2. [facility] is neither a transportation carrier nor a common or public carrier. 3. [facility] has no control over the quality or safety of the transportation that occurs as a result of the [employer]. 4. [facility] cannot guarantee that any third-party transportation provider will complete any arranged transportation service. 5. [facility] makes no representation, warranty, or guarantee regarding the reliability, timeliness, quality, safety, suitability, or availability of any of the Transport Services or that they will be error free. 6. I fully understand that traveling by vehicle involves risks and dangers of serious bodily injury, including permanent disability, paralysis, and death. I agree, on behalf of myself and on behalf of any minor child using the Transport Services for whom I am the parent or legal guardian, that the entire risk arising out of my use of the [employer] remains solely with me, to the maximum extent permitted under applicable law. 7. The [employer]  are provided "as is" and "as available." [facility] disclaims all representations and warranties, express, implied or statutory, not expressly set out in these terms, including the implied warranties of merchantability and fitness for a particular purpose. 8. I hereby waive and release [facility], its agents, employees, officers, directors, representatives, insurers, attorneys, assigns, successors, subsidiaries, and affiliates from any and all past, present, or future claims, demands, liabilities, actions, causes of action, or suits of any kind directly or indirectly arising from acceptance and use of the [employer]. 9. I further waive and release [facility] and its affiliates from all present and future liability and responsibility for any injury or death to persons or damages to property caused by or related to the use of the [employer]. 10. I have read this Waiver and Release of Liability, and I understand the terms used in it and their legal significance. This Waiver is freely and voluntarily given with the understanding that my right (as well as the right of any minor child for whom I am the parent or legal guardian using the [employer]) to legal recourse against [facility] in connection with the [employer] is knowingly surrendered in return for use of these services.   I attest that I read the consent document to [name], gave Ms. Goldbach the opportunity to ask questions and answered the questions asked (if any). I affirm that [name] then provided consent for she's participation in this program.     Launa Grill

## 2019-11-05 ENCOUNTER — Encounter: Payer: Medicaid Other | Admitting: Obstetrics and Gynecology

## 2019-11-27 ENCOUNTER — Encounter: Payer: Self-pay | Admitting: General Practice

## 2019-11-27 ENCOUNTER — Other Ambulatory Visit: Payer: Self-pay

## 2019-11-27 ENCOUNTER — Ambulatory Visit (INDEPENDENT_AMBULATORY_CARE_PROVIDER_SITE_OTHER): Payer: Medicaid Other | Admitting: Obstetrics and Gynecology

## 2019-11-27 ENCOUNTER — Other Ambulatory Visit (HOSPITAL_COMMUNITY)
Admission: RE | Admit: 2019-11-27 | Discharge: 2019-11-27 | Disposition: A | Discharge status: Home / Self Care | Payer: Medicaid Other | Source: Ambulatory Visit | Attending: Obstetrics and Gynecology | Admitting: Obstetrics and Gynecology

## 2019-11-27 ENCOUNTER — Encounter: Payer: Self-pay | Admitting: Obstetrics and Gynecology

## 2019-11-27 VITALS — BP 119/67 | HR 91 | Temp 98.1°F | Wt 157.2 lb

## 2019-11-27 DIAGNOSIS — Z3480 Encounter for supervision of other normal pregnancy, unspecified trimester: Secondary | ICD-10-CM | POA: Diagnosis present

## 2019-11-27 DIAGNOSIS — Z113 Encounter for screening for infections with a predominantly sexual mode of transmission: Secondary | ICD-10-CM | POA: Insufficient documentation

## 2019-11-27 DIAGNOSIS — Z3A21 21 weeks gestation of pregnancy: Secondary | ICD-10-CM | POA: Insufficient documentation

## 2019-11-27 NOTE — Progress Notes (Signed)
   LOW-RISK PREGNANCY OFFICE VISIT Patient name: Wendy Smith MRN [id number]  Date of birth: 08/05/1995 Chief Complaint:   Routine Prenatal Visit  History of Present Illness:   Wendy Smith is a 24 y.o. G2P0010 female at [redacted]w[redacted]d with an Estimated Date of Delivery: 04/05/20 being seen today for ongoing management of a low-risk pregnancy.  Today she reports no complaints. Contractions: Not present. Vag. Bleeding: None.  Movement: Present. denies leaking of fluid. Review of Systems:   Pertinent items are noted in HPI Denies abnormal vaginal discharge w/ itching/odor/irritation, headaches, visual changes, shortness of breath, chest pain, abdominal pain, severe nausea/vomiting, or problems with urination or bowel movements unless otherwise stated above. Pertinent History Reviewed:  Reviewed past medical,surgical, social, obstetrical and family history.  Reviewed problem list, medications and allergies. Physical Assessment:   Vitals:   11/27/19 1328  BP: 119/67  Pulse: 91  Temp: 98.1 F (36.7 C)  Weight: 157 lb 3.2 oz (71.3 kg)  Body mass index is 23.9 kg/m.        Physical Examination:   General appearance: Well appearing, and in no distress  Mental status: Alert, oriented to person, place, and time  Skin: Warm & dry  Cardiovascular: Normal heart rate noted  Respiratory: Normal respiratory effort, no distress  Abdomen: Soft, gravid, nontender  Pelvic: Cervical exam deferred         Extremities: Edema: None  Fetal Status: Fetal Heart Rate (bpm): 147 Fundal Height: 19 cm Movement: Present    No results found for this or any previous visit (from the past 24 hour(s)).  Assessment & Plan:  1) Low-risk pregnancy G2P0010 at [redacted]w[redacted]d with an Estimated Date of Delivery: 04/05/20   2) Supervision of other normal pregnancy, antepartum  - Cervicovaginal ancillary only( [facility]),  - Genetic Screening  3) [redacted] weeks gestation of pregnancy  - Cervicovaginal ancillary only( [facility]),  -  Genetic Screening  4) Screen for STD (sexually transmitted disease)  - Cervicovaginal ancillary only( )     Meds: No orders of the defined types were placed in this encounter.  Labs/procedures today: Wet Prep  Plan:  Continue routine obstetrical care   Reviewed: Preterm labor symptoms and general obstetric precautions including but not limited to vaginal bleeding, contractions, leaking of fluid and fetal movement were reviewed in detail with the patient.  All questions were answered. Has home bp cuff. Check bp weekly, let us know if >140/90.   Follow-up: Return in about 4 weeks (around 12/25/2019) for Return OB - My Chart video.  Orders Placed This Encounter  Procedures  . Genetic Screening   Raelyn Mora MSN, [place] 11/27/2019

## 2019-11-28 LAB — CERVICOVAGINAL ANCILLARY ONLY
Bacterial Vaginitis (gardnerella): NEGATIVE
Candida Glabrata: NEGATIVE
Candida Vaginitis: NEGATIVE
Chlamydia: NEGATIVE
Comment: NEGATIVE
Comment: NEGATIVE
Comment: NEGATIVE
Comment: NEGATIVE
Comment: NEGATIVE
Comment: NORMAL
Neisseria Gonorrhea: NEGATIVE
Trichomonas: NEGATIVE

## 2019-12-04 ENCOUNTER — Other Ambulatory Visit: Payer: Self-pay | Admitting: Obstetrics and Gynecology

## 2019-12-04 DIAGNOSIS — Z3480 Encounter for supervision of other normal pregnancy, unspecified trimester: Secondary | ICD-10-CM

## 2019-12-05 ENCOUNTER — Other Ambulatory Visit: Payer: Self-pay | Admitting: *Deleted

## 2019-12-05 ENCOUNTER — Encounter: Payer: Self-pay | Admitting: General Practice

## 2019-12-05 ENCOUNTER — Other Ambulatory Visit: Payer: Self-pay

## 2019-12-05 ENCOUNTER — Ambulatory Visit: Payer: Medicaid Other | Attending: Obstetrics and Gynecology

## 2019-12-05 ENCOUNTER — Other Ambulatory Visit: Payer: Self-pay | Admitting: Obstetrics and Gynecology

## 2019-12-05 DIAGNOSIS — Z362 Encounter for other antenatal screening follow-up: Secondary | ICD-10-CM

## 2019-12-05 DIAGNOSIS — Z3480 Encounter for supervision of other normal pregnancy, unspecified trimester: Secondary | ICD-10-CM | POA: Diagnosis not present

## 2019-12-11 ENCOUNTER — Encounter: Payer: Self-pay | Admitting: General Practice

## 2019-12-24 ENCOUNTER — Telehealth: Payer: Medicaid Other | Admitting: Obstetrics & Gynecology

## 2019-12-24 ENCOUNTER — Encounter: Payer: Self-pay | Admitting: Obstetrics & Gynecology

## 2020-01-02 ENCOUNTER — Ambulatory Visit: Payer: Medicaid Other | Attending: Obstetrics

## 2020-01-02 ENCOUNTER — Ambulatory Visit: Payer: Medicaid Other | Admitting: *Deleted

## 2020-01-02 ENCOUNTER — Other Ambulatory Visit: Payer: Self-pay

## 2020-01-02 DIAGNOSIS — Z3480 Encounter for supervision of other normal pregnancy, unspecified trimester: Secondary | ICD-10-CM | POA: Insufficient documentation

## 2020-01-02 DIAGNOSIS — O23591 Infection of other part of genital tract in pregnancy, first trimester: Secondary | ICD-10-CM | POA: Diagnosis present

## 2020-01-02 DIAGNOSIS — A5901 Trichomonal vulvovaginitis: Secondary | ICD-10-CM

## 2020-01-02 DIAGNOSIS — Z3A24 24 weeks gestation of pregnancy: Secondary | ICD-10-CM

## 2020-01-02 DIAGNOSIS — Z6791 Unspecified blood type, Rh negative: Secondary | ICD-10-CM | POA: Insufficient documentation

## 2020-01-02 DIAGNOSIS — O26892 Other specified pregnancy related conditions, second trimester: Secondary | ICD-10-CM | POA: Insufficient documentation

## 2020-01-02 DIAGNOSIS — Z362 Encounter for other antenatal screening follow-up: Secondary | ICD-10-CM | POA: Diagnosis not present

## 2020-01-02 DIAGNOSIS — O358XX0 Maternal care for other (suspected) fetal abnormality and damage, not applicable or unspecified: Secondary | ICD-10-CM

## 2020-01-14 ENCOUNTER — Ambulatory Visit (INDEPENDENT_AMBULATORY_CARE_PROVIDER_SITE_OTHER): Payer: Medicaid Other | Admitting: Certified Nurse Midwife

## 2020-01-14 ENCOUNTER — Other Ambulatory Visit: Payer: Self-pay

## 2020-01-14 ENCOUNTER — Encounter: Payer: Self-pay | Admitting: Certified Nurse Midwife

## 2020-01-14 ENCOUNTER — Encounter: Payer: Self-pay | Admitting: General Practice

## 2020-01-14 VITALS — BP 106/68 | HR 87 | Temp 97.9°F | Wt 167.6 lb

## 2020-01-14 DIAGNOSIS — Z23 Encounter for immunization: Secondary | ICD-10-CM

## 2020-01-14 DIAGNOSIS — Z3A26 26 weeks gestation of pregnancy: Secondary | ICD-10-CM

## 2020-01-14 DIAGNOSIS — O99019 Anemia complicating pregnancy, unspecified trimester: Secondary | ICD-10-CM

## 2020-01-14 DIAGNOSIS — D509 Iron deficiency anemia, unspecified: Secondary | ICD-10-CM

## 2020-01-14 DIAGNOSIS — Z3480 Encounter for supervision of other normal pregnancy, unspecified trimester: Secondary | ICD-10-CM | POA: Diagnosis not present

## 2020-01-14 DIAGNOSIS — D563 Thalassemia minor: Secondary | ICD-10-CM | POA: Insufficient documentation

## 2020-01-14 NOTE — Progress Notes (Signed)
   PRENATAL VISIT NOTE  Subjective:  Wendy Smith is a 24 y.o. G2P0010 at [redacted]w[redacted]d being seen today for ongoing prenatal care.  She is currently monitored for the following issues for this low-risk pregnancy and has Supervision of other normal pregnancy, antepartum; Closed fracture of left acetabulum (HCC); Trichomonal vaginitis in pregnancy; Rh negative status during pregnancy; and Alpha thalassemia silent carrier on their problem list.  Patient reports no complaints.  Contractions: Not present. Vag. Bleeding: None.  Movement: Present. Denies leaking of fluid.   The following portions of the patient's history were reviewed and updated as appropriate: allergies, current medications, past family history, past medical history, past social history, past surgical history and problem list.   Objective:   Vitals:   01/14/20 0804  BP: 106/68  Pulse: 87  Temp: 97.9 F (36.6 C)  Weight: 167 lb 9.6 oz (76 kg)    Fetal Status: Fetal Heart Rate (bpm): 130   Movement: Present     General:  Alert, oriented and cooperative. Patient is in no acute distress.  Skin: Skin is warm and dry. No rash noted.   Cardiovascular: Normal heart rate noted  Respiratory: Normal respiratory effort, no problems with respiration noted  Abdomen: Soft, gravid, appropriate for gestational age.  Pain/Pressure: Absent     Pelvic: Cervical exam deferred        Extremities: Normal range of motion.  Edema: None  Mental Status: Normal mood and affect. Normal behavior. Normal judgment and thought content.   Assessment and Plan:  Pregnancy: G2P0010 at [redacted]w[redacted]d 1. Supervision of other normal pregnancy, antepartum - Patient doing well, no complaints - Patient has concerns about having vaginal delivery due to patient having hx of acetabulum fx to left hip- patient has full mobility of right hip  - Educated and discussed different positions during labor and recommended left tilt for pushing due to hx of fracture, patient verbalizes  understanding and is happy that she can have vaginal delivery  - Routine prenatal care - Anticipatory guidance on upcoming appointments with next appointment in 2 weeks for Rhogam injection  - HIV Antibody (routine testing w rflx) - RPR - CBC - Glucose Tolerance, 2 Hours w/1 Hour - Tdap vaccine greater than or equal to 7yo IM  2. Need for tetanus, diphtheria, and acellular pertussis (Tdap) vaccine in patient of adolescent age or older - Tdap vaccine greater than or equal to 7yo IM  3. Need for immunization against influenza - Flu Vaccine QUAD 36+ mos IM  4. [redacted] weeks gestation of pregnancy - Educated and discussed rhogam injection with patient and what it is done for  - Discussed with patient results of GTT and what abnormal results mean   Preterm labor symptoms and general obstetric precautions including but not limited to vaginal bleeding, contractions, leaking of fluid and fetal movement were reviewed in detail with the patient. Please refer to After Visit Summary for other counseling recommendations.   Return in about 2 weeks (around 01/28/2020) for ROB/Rhogam.  Future Appointments  Date Time Provider Department Center  01/28/2020  3:10 PM Raelyn Mora, CNM [facility] None    Sharyon Cable, CNM

## 2020-01-14 NOTE — Patient Instructions (Signed)
Glucose Tolerance Test During Pregnancy Why am I having this test? The glucose tolerance test (GTT) is done to check how your body processes sugar (glucose). This is one of several tests used to diagnose diabetes that develops during pregnancy (gestational diabetes mellitus). Gestational diabetes is a temporary form of diabetes that some women develop during pregnancy. It usually occurs during the second trimester of pregnancy and goes away after delivery. Testing (screening) for gestational diabetes usually occurs between 24 and 28 weeks of pregnancy. You may have the GTT test after having a 1-hour glucose screening test if the results from that test indicate that you may have gestational diabetes. You may also have this test if:  You have a history of gestational diabetes.  You have a history of giving birth to very large babies or have experienced repeated fetal loss (stillbirth).  You have signs and symptoms of diabetes, such as: ? Changes in your vision. ? Tingling or numbness in your hands or feet. ? Changes in hunger, thirst, and urination that are not otherwise explained by your pregnancy. What is being tested? This test measures the amount of glucose in your blood at different times during a period of 3 hours. This indicates how well your body is able to process glucose. What kind of sample is taken?  Blood samples are required for this test. They are usually collected by inserting a needle into a blood vessel. How do I prepare for this test?  For 3 days before your test, eat normally. Have plenty of carbohydrate-rich foods.  Follow instructions from your health care provider about: ? Eating or drinking restrictions on the day of the test. You may be asked to not eat or drink anything other than water (fast) starting 8-10 hours before the test. ? Changing or stopping your regular medicines. Some medicines may interfere with this test. Tell a health care provider about:  All  medicines you are taking, including vitamins, herbs, eye drops, creams, and over-the-counter medicines.  Any blood disorders you have.  Any surgeries you have had.  Any medical conditions you have. What happens during the test? First, your blood glucose will be measured. This is referred to as your fasting blood glucose, since you fasted before the test. Then, you will drink a glucose solution that contains a certain amount of glucose. Your blood glucose will be measured again 1, 2, and 3 hours after drinking the solution. This test takes about 3 hours to complete. You will need to stay at the testing location during this time. During the testing period:  Do not eat or drink anything other than the glucose solution.  Do not exercise.  Do not use any products that contain nicotine or tobacco, such as cigarettes and e-cigarettes. If you need help stopping, ask your health care provider. The testing procedure may vary among health care providers and hospitals. How are the results reported? Your results will be reported as milligrams of glucose per deciliter of blood (mg/dL) or millimoles per liter (mmol/L). Your health care provider will compare your results to normal ranges that were established after testing a large group of people (reference ranges). Reference ranges may vary among labs and hospitals. For this test, common reference ranges are:  Fasting: less than 95-105 mg/dL (5.3-5.8 mmol/L).  1 hour after drinking glucose: less than 180-190 mg/dL (10.0-10.5 mmol/L).  2 hours after drinking glucose: less than 155-165 mg/dL (8.6-9.2 mmol/L).  3 hours after drinking glucose: 140-145 mg/dL (7.8-8.1 mmol/L). What do the   results mean? Results within reference ranges are considered normal, meaning that your glucose levels are well-controlled. If two or more of your blood glucose levels are high, you may be diagnosed with gestational diabetes. If only one level is high, your health care  provider may suggest repeat testing or other tests to confirm a diagnosis. Talk with your health care provider about what your results mean. Questions to ask your health care provider Ask your health care provider, or the department that is doing the test:  When will my results be ready?  How will I get my results?  What are my treatment options?  What other tests do I need?  What are my next steps? Summary  The glucose tolerance test (GTT) is one of several tests used to diagnose diabetes that develops during pregnancy (gestational diabetes mellitus). Gestational diabetes is a temporary form of diabetes that some women develop during pregnancy.  You may have the GTT test after having a 1-hour glucose screening test if the results from that test indicate that you may have gestational diabetes. You may also have this test if you have any symptoms or risk factors for gestational diabetes.  Talk with your health care provider about what your results mean. This information is not intended to replace advice given to you by your health care provider. Make sure you discuss any questions you have with your health care provider. Document Revised: 07/04/2018 Document Reviewed: 10/23/2016 Elsevier Patient Education  2020 Elsevier Inc.  

## 2020-01-15 ENCOUNTER — Telehealth: Payer: Self-pay | Admitting: *Deleted

## 2020-01-15 ENCOUNTER — Encounter: Payer: Self-pay | Admitting: Certified Nurse Midwife

## 2020-01-15 DIAGNOSIS — D509 Iron deficiency anemia, unspecified: Secondary | ICD-10-CM | POA: Insufficient documentation

## 2020-01-15 DIAGNOSIS — O99019 Anemia complicating pregnancy, unspecified trimester: Secondary | ICD-10-CM

## 2020-01-15 LAB — CBC
Hematocrit: 30.1 % — ABNORMAL LOW (ref 34.0–46.6)
Hemoglobin: 9.6 g/dL — ABNORMAL LOW (ref 11.1–15.9)
MCH: 27.5 pg (ref 26.6–33.0)
MCHC: 31.9 g/dL (ref 31.5–35.7)
MCV: 86 fL (ref 79–97)
Platelets: 274 10*3/uL (ref 150–450)
RBC: 3.49 x10E6/uL — ABNORMAL LOW (ref 3.77–5.28)
RDW: 12.3 % (ref 11.7–15.4)
WBC: 9.4 10*3/uL (ref 3.4–10.8)

## 2020-01-15 LAB — HIV ANTIBODY (ROUTINE TESTING W REFLEX): HIV Screen 4th Generation wRfx: NONREACTIVE

## 2020-01-15 LAB — GLUCOSE TOLERANCE, 2 HOURS W/ 1HR
Glucose, 1 hour: 71 mg/dL (ref 65–179)
Glucose, 2 hour: 63 mg/dL — ABNORMAL LOW (ref 65–152)
Glucose, Fasting: 83 mg/dL (ref 65–91)

## 2020-01-15 LAB — RPR: RPR Ser Ql: NONREACTIVE

## 2020-01-15 MED ORDER — FERROUS SULFATE 325 (65 FE) MG PO TABS: 325.0000 mg | ORAL_TABLET | ORAL | 0 refills | Status: DC

## 2020-01-15 NOTE — Telephone Encounter (Signed)
Patient verified DOB. Patient informed that iron level is low and medication was sent to pharmacy. Advised to take 1 tablet every other day in the AM.   Clovis Pu, RN

## 2020-01-15 NOTE — Addendum Note (Signed)
Addended by: Sharyon Cable on: 01/15/2020 10:03 AM   Modules accepted: Orders

## 2020-01-15 NOTE — Telephone Encounter (Signed)
-----   Message from Sharyon Cable, CNM sent at 01/15/2020 10:03 AM EDT ----- Please call patient and notify of iron deficiency anemia during pregnancy. I have sent in iron supplements to her pharmacy. She will take them every other day in the morning.   Sharyon Cable, CNM 01/15/20, 10:02 AM

## 2020-01-28 ENCOUNTER — Encounter: Payer: Self-pay | Admitting: General Practice

## 2020-01-28 ENCOUNTER — Other Ambulatory Visit: Payer: Self-pay

## 2020-01-28 ENCOUNTER — Ambulatory Visit (INDEPENDENT_AMBULATORY_CARE_PROVIDER_SITE_OTHER): Payer: Medicaid Other | Admitting: Obstetrics and Gynecology

## 2020-01-28 VITALS — BP 118/70 | HR 91 | Temp 97.9°F | Wt 174.4 lb

## 2020-01-28 DIAGNOSIS — O99019 Anemia complicating pregnancy, unspecified trimester: Secondary | ICD-10-CM

## 2020-01-28 DIAGNOSIS — Z3A28 28 weeks gestation of pregnancy: Secondary | ICD-10-CM

## 2020-01-28 DIAGNOSIS — Z6791 Unspecified blood type, Rh negative: Secondary | ICD-10-CM | POA: Diagnosis not present

## 2020-01-28 DIAGNOSIS — Z3480 Encounter for supervision of other normal pregnancy, unspecified trimester: Secondary | ICD-10-CM | POA: Diagnosis not present

## 2020-01-28 DIAGNOSIS — O26899 Other specified pregnancy related conditions, unspecified trimester: Secondary | ICD-10-CM

## 2020-01-28 DIAGNOSIS — D509 Iron deficiency anemia, unspecified: Secondary | ICD-10-CM

## 2020-01-28 MED ORDER — ASCORBIC ACID 500 MG PO TABS: 500.0000 mg | ORAL_TABLET | ORAL | 3 refills | Status: DC

## 2020-01-28 MED ORDER — RHO D IMMUNE GLOBULIN 1500 UNIT/2ML IJ SOSY
300.0000 ug | PREFILLED_SYRINGE | Freq: Once | INTRAMUSCULAR | Status: AC
  Administered 2020-01-28: 300 ug via INTRAMUSCULAR

## 2020-01-28 MED ORDER — FERROUS SULFATE 325 (65 FE) MG PO TABS: 325.0000 mg | ORAL_TABLET | ORAL | 3 refills | Status: DC

## 2020-02-02 ENCOUNTER — Encounter: Payer: Self-pay | Admitting: Obstetrics and Gynecology

## 2020-02-02 NOTE — Progress Notes (Signed)
   LOW-RISK PREGNANCY OFFICE VISIT Patient name: Wendy Smith MRN [id number]  Date of birth: [birth year] Chief Complaint:   Routine Prenatal Visit  History of Present Illness:   Wendy Smith is a 24 y.o. G2P0010 female at [redacted]w[redacted]d with an Estimated Date of Delivery: 04/19/20 being seen today for ongoing management of a low-risk pregnancy.  Today she reports no complaints. Contractions: Not present. Vag. Bleeding: None.  Movement: Present. denies leaking of fluid. Review of Systems:   Pertinent items are noted in HPI Denies abnormal vaginal discharge w/ itching/odor/irritation, headaches, visual changes, shortness of breath, chest pain, abdominal pain, severe nausea/vomiting, or problems with urination or bowel movements unless otherwise stated above. Pertinent History Reviewed:  Reviewed past medical,surgical, social, obstetrical and family history.  Reviewed problem list, medications and allergies. Physical Assessment:   Vitals:   01/28/20 1527  BP: 118/70  Pulse: 91  Temp: 97.9 F (36.6 C)  Weight: 174 lb 6.4 oz (79.1 kg)  Body mass index is 26.52 kg/m.        Physical Examination:   General appearance: Well appearing, and in no distress  Mental status: Alert, oriented to person, place, and time  Skin: Warm & dry  Cardiovascular: Normal heart rate noted  Respiratory: Normal respiratory effort, no distress  Abdomen: Soft, gravid, nontender  Pelvic: Cervical exam deferred         Extremities: Edema: None  Fetal Status: Fetal Heart Rate (bpm): 125   Movement: Present    No results found for this or any previous visit (from the past 24 hour(s)).  Assessment & Plan:  1) Low-risk pregnancy G2P0010 at [redacted]w[redacted]d with an Estimated Date of Delivery: 04/19/20   2) Supervision of other normal pregnancy, antepartum - Discussed normal 2hr GTT results - Naming baby girl Ny'layah [place] (pronounced Nie-ala)   3) Iron deficiency anemia during pregnancy  - ferrous sulfate (FERROUSUL) 325 (65 FE)  MG tablet,  - ascorbic acid (VITAMIN C) 500 MG tablet  4) Rh negative, antepartum  - rho (d) immune globulin (RHIG/RHOPHYLAC) injection 300 mcg  5) [redacted] weeks gestation of pregnancy    Meds:  Meds ordered this encounter  Medications  . ferrous sulfate (FERROUSUL) 325 (65 FE) MG tablet    Sig: Take 1 tablet (325 mg total) by mouth every other day. Twice a day every other day with Vitamin C tablet    Dispense:  30 tablet    Refill:  3  . ascorbic acid (VITAMIN C) 500 MG tablet    Sig: Take 1 tablet (500 mg total) by mouth every other day. Twice a day every other day with Iron Tablet    Dispense:  30 tablet    Refill:  3  . rho (d) immune globulin (RHIG/RHOPHYLAC) injection 300 mcg   Labs/procedures today: Rhogam injection  Plan:  Continue routine obstetrical care   Reviewed: Preterm labor symptoms and general obstetric precautions including but not limited to vaginal bleeding, contractions, leaking of fluid and fetal movement were reviewed in detail with the patient.  All questions were answered. Has home bp cuff. Check bp weekly, let us know if >140/90.   Follow-up: No follow-ups on file.  No orders of the defined types were placed in this encounter.  Raelyn Mora MSN, CNM 01/28/2020

## 2020-02-12 ENCOUNTER — Encounter: Payer: Medicaid Other | Admitting: Obstetrics and Gynecology

## 2020-02-26 ENCOUNTER — Other Ambulatory Visit: Payer: Self-pay

## 2020-02-26 ENCOUNTER — Encounter: Payer: Self-pay | Admitting: Obstetrics and Gynecology

## 2020-02-26 ENCOUNTER — Ambulatory Visit (INDEPENDENT_AMBULATORY_CARE_PROVIDER_SITE_OTHER): Payer: Medicaid Other | Admitting: Obstetrics and Gynecology

## 2020-02-26 VITALS — BP 119/70 | HR 92 | Temp 97.6°F | Wt 180.6 lb

## 2020-02-26 DIAGNOSIS — Z3A32 32 weeks gestation of pregnancy: Secondary | ICD-10-CM

## 2020-02-26 DIAGNOSIS — O26899 Other specified pregnancy related conditions, unspecified trimester: Secondary | ICD-10-CM

## 2020-02-26 DIAGNOSIS — D509 Iron deficiency anemia, unspecified: Secondary | ICD-10-CM

## 2020-02-26 DIAGNOSIS — R12 Heartburn: Secondary | ICD-10-CM

## 2020-02-26 DIAGNOSIS — O99019 Anemia complicating pregnancy, unspecified trimester: Secondary | ICD-10-CM

## 2020-02-26 DIAGNOSIS — Z3480 Encounter for supervision of other normal pregnancy, unspecified trimester: Secondary | ICD-10-CM

## 2020-02-26 MED ORDER — FAMOTIDINE 20 MG PO TABS: 20.0000 mg | ORAL_TABLET | Freq: Two times a day (BID) | ORAL | 1 refills | Status: DC

## 2020-02-26 NOTE — Patient Instructions (Signed)

## 2020-02-26 NOTE — Progress Notes (Signed)
   LOW-RISK PREGNANCY OFFICE VISIT Patient name: Wendy Smith MRN [id number]  Date of birth: 1995-12-30 Chief Complaint:   Routine Prenatal Visit  History of Present Illness:   Wendy Smith is a 24 y.o. G50P0010 female at [redacted]w[redacted]d with an Estimated Date of Delivery: 04/19/20 being seen today for ongoing management of a low-risk pregnancy.  Today she reports heartburn. Contractions: Not present. Vag. Bleeding: None.  Movement: Present. denies leaking of fluid. Review of Systems:   Pertinent items are noted in HPI Denies abnormal vaginal discharge w/ itching/odor/irritation, headaches, visual changes, shortness of breath, chest pain, abdominal pain, severe nausea/vomiting, or problems with urination or bowel movements unless otherwise stated above. Pertinent History Reviewed:  Reviewed past medical,surgical, social, obstetrical and family history.  Reviewed problem list, medications and allergies. Physical Assessment:   Vitals:   02/26/20 1358  BP: 119/70  Pulse: 92  Temp: 97.6 F (36.4 C)  Weight: 180 lb 9.6 oz (81.9 kg)  Body mass index is 27.46 kg/m.        Physical Examination:   General appearance: Well appearing, and in no distress  Mental status: Alert, oriented to person, place, and time  Skin: Warm & dry  Cardiovascular: Normal heart rate noted  Respiratory: Normal respiratory effort, no distress  Abdomen: Soft, gravid, nontender  Pelvic: Cervical exam deferred         Extremities: Edema: None  Fetal Status: Fetal Heart Rate (bpm): 131 Fundal Height: 31 cm Movement: Present Presentation: Vertex  No results found for this or any previous visit (from the past 24 hour(s)).  Assessment & Plan:  1) Low-risk pregnancy G2P0010 at [redacted]w[redacted]d with an Estimated Date of Delivery: 04/19/20   2) Supervision of other normal pregnancy, antepartum - Very excited about the arrival of the baby, getting nursery and self prepared for baby's arrival  3) Heartburn during pregnancy, antepartum  -  Rx for famotidine (PEPCID) 20 MG tablet  4) Iron deficiency anemia during pregnancy - Taking FeSo4 with orange juice  5) [redacted] weeks gestation of pregnancy    Meds:  Meds ordered this encounter  Medications  . famotidine (PEPCID) 20 MG tablet    Sig: Take 1 tablet (20 mg total) by mouth 2 (two) times daily.    Dispense:  60 tablet    Refill:  1   Labs/procedures today: none  Plan:  Continue routine obstetrical care   Reviewed: Preterm labor symptoms and general obstetric precautions including but not limited to vaginal bleeding, contractions, leaking of fluid and fetal movement were reviewed in detail with the patient.  All questions were answered. Has home bp cuff. Check bp weekly, let us know if >140/90.   Follow-up: Return in about 4 weeks (around 03/25/2020) for Return OB w/GBS.  No orders of the defined types were placed in this encounter.  Raelyn Mora MSN, CNM 02/26/2020 2:09 PM

## 2020-03-11 ENCOUNTER — Encounter: Payer: Medicaid Other | Admitting: Obstetrics and Gynecology

## 2020-03-25 ENCOUNTER — Other Ambulatory Visit (HOSPITAL_COMMUNITY)
Admission: RE | Admit: 2020-03-25 | Discharge: 2020-03-25 | Disposition: A | Discharge status: Home / Self Care | Payer: Medicaid Other | Source: Ambulatory Visit | Attending: Certified Nurse Midwife | Admitting: Certified Nurse Midwife

## 2020-03-25 ENCOUNTER — Ambulatory Visit (INDEPENDENT_AMBULATORY_CARE_PROVIDER_SITE_OTHER): Payer: Medicaid Other | Admitting: Certified Nurse Midwife

## 2020-03-25 ENCOUNTER — Other Ambulatory Visit: Payer: Self-pay

## 2020-03-25 VITALS — BP 124/75 | HR 85 | Temp 98.1°F | Wt 189.6 lb

## 2020-03-25 DIAGNOSIS — Z3480 Encounter for supervision of other normal pregnancy, unspecified trimester: Secondary | ICD-10-CM | POA: Diagnosis present

## 2020-03-25 DIAGNOSIS — Z3A36 36 weeks gestation of pregnancy: Secondary | ICD-10-CM

## 2020-03-25 DIAGNOSIS — Z3689 Encounter for other specified antenatal screening: Secondary | ICD-10-CM

## 2020-03-25 NOTE — Patient Instructions (Signed)

## 2020-03-25 NOTE — Progress Notes (Signed)
   PRENATAL VISIT NOTE  Subjective:  Wendy Smith is a 24 y.o. G2P0010 at [redacted]w[redacted]d being seen today for ongoing prenatal care.  She is currently monitored for the following issues for this low-risk pregnancy and has Supervision of other normal pregnancy, antepartum; Closed fracture of left acetabulum (HCC); Trichomonal vaginitis in pregnancy; Rh negative status during pregnancy; Alpha thalassemia silent carrier; and Iron deficiency anemia during pregnancy on their problem list.  Patient reports no complaints.  Contractions: Not present. Vag. Bleeding: None.  Movement: Present. Denies leaking of fluid.   The following portions of the patient's history were reviewed and updated as appropriate: allergies, current medications, past family history, past medical history, past social history, past surgical history and problem list.   Objective:   Vitals:   03/25/20 1400  BP: 124/75  Pulse: 85  Temp: 98.1 F (36.7 C)  Weight: 189 lb 9.6 oz (86 kg)    Fetal Status: Fetal Heart Rate (bpm): 115 Fundal Height: 35 cm Movement: Present  Presentation: Vertex  General:  Alert, oriented and cooperative. Patient is in no acute distress.  Skin: Skin is warm and dry. No rash noted.   Cardiovascular: Normal heart rate noted  Respiratory: Normal respiratory effort, no problems with respiration noted  Abdomen: Soft, gravid, appropriate for gestational age.  Pain/Pressure: Absent     Pelvic: Cervical exam deferred Dilation: Fingertip Effacement (%): Thick Station: Ballotable  Extremities: Normal range of motion.  Edema: Trace  Mental Status: Normal mood and affect. Normal behavior. Normal judgment and thought content.   Assessment and Plan:  Pregnancy: G2P0010 at [redacted]w[redacted]d 1. Supervision of other normal pregnancy, antepartum - Pt doing well, no complaints  2. [redacted] weeks gestation of pregnancy - Cervicovaginal ancillary only( [place]) - Culture, beta strep (group b only)  3. NST (non-stress test)  reactive - Fetal heart rate lower than previous visits (110-115) with no accels heard during extended Doppler use. - NST performed and reactive, pt states baby is usually quieter during the day with longer active periods at night - Strongly encouraged pt to come to MAU if she ever perceives less fetal movement than normal for her, pt agreed  Preterm labor symptoms and general obstetric precautions including but not limited to vaginal bleeding, contractions, leaking of fluid and fetal movement were reviewed in detail with the patient. Please refer to After Visit Summary for other counseling recommendations.   Return in about 1 week (around 04/01/2020) for IN-PERSON, LOB.  Future Appointments  Date Time Provider Department Center  04/01/2020  3:50 PM Raelyn Mora, CNM [facility] None  04/08/2020  3:50 PM Raelyn Mora, CNM [facility] None  04/15/2020  3:30 PM Raelyn Mora, CNM [facility] None    [place], [place]

## 2020-03-27 NOTE — L&D Delivery Note (Signed)
Delivery Note Progressed through labor after 1 cytotec to C/C/+2.  Started vomiting and baby delivered.  At 6:46 AM a viable female was delivered via Vaginal, Spontaneous (Presentation: Left Occiput Anterior).  APGAR: 8, 9; weight 5 lb 2 oz (2325 g).  After 1 minute, the cord was clamped and cut. 40 units of pitocin diluted in 1000cc LR was infused rapidly IV.  The placenta separated spontaneously and delivered via CCT and maternal pushing effort.  It was inspected and appears to be intact with a 3 VC  Anesthesia: Epidural Episiotomy: None Lacerations: 1st degree;Labial Suture Repair: 3.0 chromic Est. Blood Loss (mL): 132  Mom to postpartum.  Baby to Couplet care / Skin to Skin.  Wendy Smith 04/06/2020, 8:19 AM  .

## 2020-03-28 LAB — CERVICOVAGINAL ANCILLARY ONLY
Chlamydia: NEGATIVE
Comment: NEGATIVE
Comment: NEGATIVE
Comment: NORMAL
Neisseria Gonorrhea: NEGATIVE
Trichomonas: POSITIVE — AB

## 2020-03-29 LAB — CULTURE, BETA STREP (GROUP B ONLY): Strep Gp B Culture: NEGATIVE

## 2020-04-01 ENCOUNTER — Encounter: Payer: Medicaid Other | Admitting: Obstetrics and Gynecology

## 2020-04-04 ENCOUNTER — Inpatient Hospital Stay (EMERGENCY_DEPARTMENT_HOSPITAL)
Admission: AD | Admit: 2020-04-04 | Discharge: 2020-04-04 | Disposition: A | Discharge status: Home / Self Care | Payer: Medicaid Other | Source: Home / Self Care | Attending: Obstetrics and Gynecology | Admitting: Obstetrics and Gynecology

## 2020-04-04 ENCOUNTER — Encounter (HOSPITAL_COMMUNITY): Payer: Self-pay | Admitting: Obstetrics and Gynecology

## 2020-04-04 ENCOUNTER — Other Ambulatory Visit: Payer: Self-pay

## 2020-04-04 DIAGNOSIS — O471 False labor at or after 37 completed weeks of gestation: Secondary | ICD-10-CM

## 2020-04-04 DIAGNOSIS — Z3A37 37 weeks gestation of pregnancy: Secondary | ICD-10-CM | POA: Diagnosis not present

## 2020-04-04 HISTORY — DX: Anemia, unspecified: D64.9

## 2020-04-04 NOTE — Discharge Instructions (Signed)
Safe Medications in Pregnancy  ° °Acne: °Benzoyl Peroxide °Salicylic Acid ° °Backache/Headache: °Tylenol: 2 regular strength every 4 hours OR °             2 Extra strength every 6 hours ° °Colds/Coughs/Allergies: °Benadryl (alcohol free) 25 mg every 6 hours as needed °Breath right strips °Claritin °Cepacol throat lozenges °Chloraseptic throat spray °Cold-Eeze- up to three times per day °Cough drops, alcohol free °Flonase (by prescription only) °Guaifenesin °Mucinex °Robitussin DM (plain only, alcohol free) °Saline nasal spray/drops °Sudafed (pseudoephedrine) & Actifed ** use only after [redacted] weeks gestation and if you do not have high blood pressure °Tylenol °Vicks Vaporub °Zinc lozenges °Zyrtec  ° °Constipation: °Colace °Ducolax suppositories °Fleet enema °Glycerin suppositories °Metamucil °Milk of magnesia °Miralax °Senokot °Smooth move tea ° °Diarrhea: °Kaopectate °Imodium A-D ° °*NO pepto Bismol ° °Hemorrhoids: °Anusol °Anusol HC °Preparation H °Tucks ° °Indigestion: °Tums °Maalox °Mylanta °Zantac  °Pepcid ° °Insomnia: °Benadryl (alcohol free) 25mg every 6 hours as needed °Tylenol PM °Unisom, no Gelcaps ° °Leg Cramps: °Tums °MagGel ° °Nausea/Vomiting:  °Bonine °Dramamine °Emetrol °Ginger extract °Sea bands °Meclizine  °Nausea medication to take during pregnancy:  °Unisom (doxylamine succinate 25 mg tablets) Take one tablet daily at bedtime. If symptoms are not adequately controlled, the dose can be increased to a maximum recommended dose of two tablets daily (1/2 tablet in the morning, 1/2 tablet mid-afternoon and one at bedtime). °Vitamin B6 100mg tablets. Take one tablet twice a day (up to 200 mg per day). ° °Skin Rashes: °Aveeno products °Benadryl cream or 25mg every 6 hours as needed °Calamine Lotion °1% cortisone cream ° °Yeast infection: °Gyne-lotrimin 7 °Monistat 7 ° ° °**If taking multiple medications, please check labels to avoid duplicating the same active ingredients °**take medication as directed on  the label °** Do not exceed 4000 mg of tylenol in 24 hours °**Do not take medications that contain aspirin or ibuprofen ° ° ° ° °Braxton Hicks Contractions °Contractions of the uterus can occur throughout pregnancy, but they are not always a sign that you are in labor. You may have practice contractions called Braxton Hicks contractions. These false labor contractions are sometimes confused with true labor. °What are Braxton Hicks contractions? °Braxton Hicks contractions are tightening movements that occur in the muscles of the uterus before labor. Unlike true labor contractions, these contractions do not result in opening (dilation) and thinning of the cervix. Toward the end of pregnancy (32-34 weeks), Braxton Hicks contractions can happen more often and may become stronger. These contractions are sometimes difficult to tell apart from true labor because they can be very uncomfortable. You should not feel embarrassed if you go to the hospital with false labor. °Sometimes, the only way to tell if you are in true labor is for your health care provider to look for changes in the cervix. The health care provider will do a physical exam and may monitor your contractions. If you are not in true labor, the exam should show that your cervix is not dilating and your water has not broken. °If there are no other health problems associated with your pregnancy, it is completely safe for you to be sent home with false labor. You may continue to have Braxton Hicks contractions until you go into true labor. °How to tell the difference between true labor and false labor °True labor °· Contractions last 30-70 seconds. °· Contractions become very regular. °· Discomfort is usually felt in the top of the uterus, and it spreads to the   lower abdomen and low back. °· Contractions do not go away with walking. °· Contractions usually become more intense and increase in frequency. °· The cervix dilates and gets thinner. °False  labor °· Contractions are usually shorter and not as strong as true labor contractions. °· Contractions are usually irregular. °· Contractions are often felt in the front of the lower abdomen and in the groin. °· Contractions may go away when you walk around or change positions while lying down. °· Contractions get weaker and are shorter-lasting as time goes on. °· The cervix usually does not dilate or become thin. °Follow these instructions at home: ° °· Take over-the-counter and prescription medicines only as told by your health care provider. °· Keep up with your usual exercises and follow other instructions from your health care provider. °· Eat and drink lightly if you think you are going into labor. °· If Braxton Hicks contractions are making you uncomfortable: °? Change your position from lying down or resting to walking, or change from walking to resting. °? Sit and rest in a tub of warm water. °? Drink enough fluid to keep your urine pale yellow. Dehydration may cause these contractions. °? Do slow and deep breathing several times an hour. °· Keep all follow-up prenatal visits as told by your health care provider. This is important. °Contact a health care provider if: °· You have a fever. °· You have continuous pain in your abdomen. °Get help right away if: °· Your contractions become stronger, more regular, and closer together. °· You have fluid leaking or gushing from your vagina. °· You pass blood-tinged mucus (bloody show). °· You have bleeding from your vagina. °· You have low back pain that you never had before. °· You feel your baby’s head pushing down and causing pelvic pressure. °· Your baby is not moving inside you as much as it used to. °Summary °· Contractions that occur before labor are called Braxton Hicks contractions, false labor, or practice contractions. °· Braxton Hicks contractions are usually shorter, weaker, farther apart, and less regular than true labor contractions. True labor  contractions usually become progressively stronger and regular, and they become more frequent. °· Manage discomfort from Braxton Hicks contractions by changing position, resting in a warm bath, drinking plenty of water, or practicing deep breathing. °This information is not intended to replace advice given to you by your health care provider. Make sure you discuss any questions you have with your health care provider. °Document Revised: 02/23/2017 Document Reviewed: 07/27/2016 °Elsevier Patient Education © 2020 [employer] ° °

## 2020-04-04 NOTE — MAU Provider Note (Signed)
S: Ms. MAGDALA BRAHMBHATT is a 25 y.o. G2P0010 at [redacted]w[redacted]d  who presents to MAU today complaining contractions q 2-5 minutes since 1200. She denies vaginal bleeding. She denies LOF. She reports normal fetal movement.    O: BP 136/74 (BP Location: Right Arm)   Pulse 85   Temp 98.3 F (36.8 C) (Oral)   Resp 16   Ht 5\' 8"  (1.727 m)   Wt 86.8 kg   LMP 06/30/2019 (Exact Date)   SpO2 100%   BMI 29.09 kg/m  GENERAL: Well-developed, well-nourished female in no acute distress.  HEAD: Normocephalic, atraumatic.  CHEST: Normal effort of breathing, regular heart rate ABDOMEN: Soft, nontender, gravid  Cervical exam:  Dilation: 1.5 Effacement (%): Thick Cervical Position: Posterior Station: -2 Presentation: Vertex Exam by:: Desiree. [email or web address], RN   Fetal Monitoring: Baseline: 120 Variability: moderate Accelerations: 15x15 Decelerations: none Contractions: 2-8  Patient observed and rechecked in 1 hour and is unchanged.   A: SIUP at [redacted]w[redacted]d  False labor  P: -Discharge home in stable condition -Labor precautions discussed -Patient advised to follow-up with OB as scheduled for prenatal care -Patient may return to MAU as needed or if her condition were to change or worsen   [redacted]w[redacted]d, Rolm Bookbinder 04/04/2020 2:40 PM

## 2020-04-04 NOTE — MAU Note (Signed)
Wendy Smith is a 25 y.o. at [redacted]w[redacted]d here in MAU reporting: started having mild contractions last night and now they are worse. States contractions every 2 minutes but they dont last long. Noticed her mucus plug came out. No bleeding, no LOF. +FM  Onset of complaint: last night  Pain score: 5/10  Vitals:   04/04/20 1255  BP: 134/81  Pulse: (!) 101  Resp: 16  Temp: 98.4 F (36.9 C)  SpO2: 99%     FHT: +FM  Lab orders placed from triage:

## 2020-04-05 ENCOUNTER — Inpatient Hospital Stay (HOSPITAL_COMMUNITY)
Admission: AD | Admit: 2020-04-05 | Discharge: 2020-04-07 | DRG: 806 | Disposition: A | Discharge status: Home / Self Care | Payer: Medicaid Other | Attending: Obstetrics and Gynecology | Admitting: Obstetrics and Gynecology

## 2020-04-05 ENCOUNTER — Encounter (HOSPITAL_COMMUNITY): Payer: Self-pay | Admitting: Family Medicine

## 2020-04-05 ENCOUNTER — Other Ambulatory Visit: Payer: Self-pay

## 2020-04-05 DIAGNOSIS — O9832 Other infections with a predominantly sexual mode of transmission complicating childbirth: Secondary | ICD-10-CM | POA: Diagnosis present

## 2020-04-05 DIAGNOSIS — D563 Thalassemia minor: Secondary | ICD-10-CM | POA: Diagnosis present

## 2020-04-05 DIAGNOSIS — Z87891 Personal history of nicotine dependence: Secondary | ICD-10-CM | POA: Diagnosis not present

## 2020-04-05 DIAGNOSIS — Z3A38 38 weeks gestation of pregnancy: Secondary | ICD-10-CM

## 2020-04-05 DIAGNOSIS — O4292 Full-term premature rupture of membranes, unspecified as to length of time between rupture and onset of labor: Secondary | ICD-10-CM | POA: Diagnosis present

## 2020-04-05 DIAGNOSIS — Z6791 Unspecified blood type, Rh negative: Secondary | ICD-10-CM | POA: Diagnosis not present

## 2020-04-05 DIAGNOSIS — A599 Trichomoniasis, unspecified: Secondary | ICD-10-CM

## 2020-04-05 DIAGNOSIS — O134 Gestational [pregnancy-induced] hypertension without significant proteinuria, complicating childbirth: Secondary | ICD-10-CM | POA: Diagnosis present

## 2020-04-05 DIAGNOSIS — Z20822 Contact with and (suspected) exposure to covid-19: Secondary | ICD-10-CM | POA: Diagnosis present

## 2020-04-05 DIAGNOSIS — O9902 Anemia complicating childbirth: Secondary | ICD-10-CM | POA: Diagnosis present

## 2020-04-05 DIAGNOSIS — D509 Iron deficiency anemia, unspecified: Secondary | ICD-10-CM | POA: Diagnosis present

## 2020-04-05 DIAGNOSIS — O4202 Full-term premature rupture of membranes, onset of labor within 24 hours of rupture: Secondary | ICD-10-CM | POA: Diagnosis not present

## 2020-04-05 DIAGNOSIS — O26893 Other specified pregnancy related conditions, third trimester: Secondary | ICD-10-CM | POA: Diagnosis present

## 2020-04-05 DIAGNOSIS — A5901 Trichomonal vulvovaginitis: Secondary | ICD-10-CM | POA: Diagnosis present

## 2020-04-05 DIAGNOSIS — Z3490 Encounter for supervision of normal pregnancy, unspecified, unspecified trimester: Secondary | ICD-10-CM | POA: Diagnosis present

## 2020-04-05 LAB — TYPE AND SCREEN
ABO/RH(D): O NEG
Antibody Screen: NEGATIVE

## 2020-04-05 LAB — AMNISURE RUPTURE OF MEMBRANE (ROM) NOT AT ARMC: Amnisure ROM: POSITIVE

## 2020-04-05 LAB — CBC
HCT: 32.8 % — ABNORMAL LOW (ref 36.0–46.0)
Hemoglobin: 10.1 g/dL — ABNORMAL LOW (ref 12.0–15.0)
MCH: 26.4 pg (ref 26.0–34.0)
MCHC: 30.8 g/dL (ref 30.0–36.0)
MCV: 85.6 fL (ref 80.0–100.0)
Platelets: 318 10*3/uL (ref 150–400)
RBC: 3.83 MIL/uL — ABNORMAL LOW (ref 3.87–5.11)
RDW: 13.4 % (ref 11.5–15.5)
WBC: 10.1 10*3/uL (ref 4.0–10.5)
nRBC: 0 % (ref 0.0–0.2)

## 2020-04-05 MED ORDER — ONDANSETRON HCL 4 MG/2ML IJ SOLN
4.0000 mg | Freq: Four times a day (QID) | INTRAMUSCULAR | Status: DC | PRN
  Administered 2020-04-06: 4 mg via INTRAVENOUS
  Filled 2020-04-05: qty 2

## 2020-04-05 MED ORDER — LACTATED RINGERS IV SOLN: INTRAVENOUS | Status: DC

## 2020-04-05 MED ORDER — OXYTOCIN BOLUS FROM INFUSION
333.0000 mL | Freq: Once | INTRAVENOUS | Status: AC
  Administered 2020-04-06: 333 mL via INTRAVENOUS

## 2020-04-05 MED ORDER — LACTATED RINGERS IV SOLN
500.0000 mL | INTRAVENOUS | Status: DC | PRN
Start: 2020-04-05 — End: 2020-04-06

## 2020-04-05 MED ORDER — OXYTOCIN-SODIUM CHLORIDE 30-0.9 UT/500ML-% IV SOLN
2.5000 [IU]/h | INTRAVENOUS | Status: DC
  Filled 2020-04-05: qty 500

## 2020-04-05 MED ORDER — ACETAMINOPHEN 325 MG PO TABS
650.0000 mg | ORAL_TABLET | ORAL | Status: DC | PRN
  Administered 2020-04-06: 650 mg via ORAL
  Filled 2020-04-05: qty 2

## 2020-04-05 MED ORDER — FENTANYL CITRATE (PF) 100 MCG/2ML IJ SOLN
50.0000 ug | INTRAMUSCULAR | Status: DC | PRN
  Filled 2020-04-05: qty 2

## 2020-04-05 MED ORDER — SOD CITRATE-CITRIC ACID 500-334 MG/5ML PO SOLN: 30.0000 mL | ORAL | Status: DC | PRN

## 2020-04-05 MED ORDER — LIDOCAINE HCL (PF) 1 % IJ SOLN: 30.0000 mL | INTRAMUSCULAR | Status: DC | PRN

## 2020-04-05 NOTE — MAU Note (Signed)
Pt states she was in the shower and she felt a big gush come out around 7 pm. Endorses good fetal movement, no leaking since then "just feel like I have to pee a lot". Feeling "little contractions". No bleeding.
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
## 2020-04-06 ENCOUNTER — Encounter (HOSPITAL_COMMUNITY): Payer: Self-pay | Admitting: Obstetrics and Gynecology

## 2020-04-06 ENCOUNTER — Inpatient Hospital Stay (HOSPITAL_COMMUNITY): Payer: Medicaid Other | Admitting: Anesthesiology

## 2020-04-06 ENCOUNTER — Telehealth: Payer: Self-pay | Admitting: *Deleted

## 2020-04-06 DIAGNOSIS — Z3A38 38 weeks gestation of pregnancy: Secondary | ICD-10-CM

## 2020-04-06 DIAGNOSIS — O4202 Full-term premature rupture of membranes, onset of labor within 24 hours of rupture: Secondary | ICD-10-CM

## 2020-04-06 DIAGNOSIS — A599 Trichomoniasis, unspecified: Secondary | ICD-10-CM

## 2020-04-06 LAB — COMPREHENSIVE METABOLIC PANEL
ALT: 13 U/L (ref 0–44)
AST: 17 U/L (ref 15–41)
Albumin: 3 g/dL — ABNORMAL LOW (ref 3.5–5.0)
Alkaline Phosphatase: 136 U/L — ABNORMAL HIGH (ref 38–126)
Anion gap: 9 (ref 5–15)
BUN: 10 mg/dL (ref 6–20)
CO2: 20 mmol/L — ABNORMAL LOW (ref 22–32)
Calcium: 9.1 mg/dL (ref 8.9–10.3)
Chloride: 109 mmol/L (ref 98–111)
Creatinine, Ser: 0.6 mg/dL (ref 0.44–1.00)
GFR, Estimated: >60 mL/min (ref >60)
Glucose, Bld: 81 mg/dL (ref 70–99)
Potassium: 3.5 mmol/L (ref 3.5–5.1)
Sodium: 138 mmol/L (ref 135–145)
Total Bilirubin: 0.2 mg/dL — ABNORMAL LOW (ref 0.3–1.2)
Total Protein: 6.3 g/dL — ABNORMAL LOW (ref 6.5–8.1)

## 2020-04-06 LAB — PROTEIN / CREATININE RATIO, URINE
Creatinine, Urine: 48.67 mg/dL
Protein Creatinine Ratio: 0.16 mg/mg{Cre} — ABNORMAL HIGH (ref 0.00–0.15)
Total Protein, Urine: 8 mg/dL

## 2020-04-06 LAB — RPR: RPR Ser Ql: NONREACTIVE

## 2020-04-06 LAB — SARS CORONAVIRUS 2 (TAT 6-24 HRS): SARS Coronavirus 2: NEGATIVE

## 2020-04-06 MED ORDER — FENTANYL-BUPIVACAINE-NACL 0.5-0.125-0.9 MG/250ML-% EP SOLN
EPIDURAL | Status: AC
  Filled 2020-04-06: qty 250

## 2020-04-06 MED ORDER — FERROUS SULFATE 325 (65 FE) MG PO TABS: 325.0000 mg | ORAL_TABLET | ORAL | Status: DC

## 2020-04-06 MED ORDER — METHYLERGONOVINE MALEATE 0.2 MG/ML IJ SOLN: 0.2000 mg | INTRAMUSCULAR | Status: DC | PRN

## 2020-04-06 MED ORDER — EPHEDRINE 5 MG/ML INJ: 10.0000 mg | INTRAVENOUS | Status: DC | PRN

## 2020-04-06 MED ORDER — ONDANSETRON HCL 4 MG/2ML IJ SOLN: 4.0000 mg | INTRAMUSCULAR | Status: DC | PRN

## 2020-04-06 MED ORDER — TERBUTALINE SULFATE 1 MG/ML IJ SOLN: 0.2500 mg | Freq: Once | INTRAMUSCULAR | Status: DC | PRN

## 2020-04-06 MED ORDER — SODIUM CHLORIDE (PF) 0.9 % IJ SOLN
INTRAMUSCULAR | Status: DC | PRN
  Administered 2020-04-06: 12 mL/h via EPIDURAL

## 2020-04-06 MED ORDER — BISACODYL 10 MG RE SUPP: 10.0000 mg | Freq: Every day | RECTAL | Status: DC | PRN

## 2020-04-06 MED ORDER — DOCUSATE SODIUM 100 MG PO CAPS
100.0000 mg | ORAL_CAPSULE | Freq: Two times a day (BID) | ORAL | Status: DC
  Administered 2020-04-06 – 2020-04-07 (×2): 100 mg via ORAL
  Filled 2020-04-06 (×2): qty 1

## 2020-04-06 MED ORDER — MEDROXYPROGESTERONE ACETATE 150 MG/ML IM SUSP
150.0000 mg | INTRAMUSCULAR | Status: DC | PRN
Start: 2020-04-06 — End: 2020-04-07

## 2020-04-06 MED ORDER — PRENATAL MULTIVITAMIN CH
1.0000 | ORAL_TABLET | Freq: Every day | ORAL | Status: DC
  Administered 2020-04-06 – 2020-04-07 (×2): 1 via ORAL
  Filled 2020-04-06 (×2): qty 1

## 2020-04-06 MED ORDER — FENTANYL-BUPIVACAINE-NACL 0.5-0.125-0.9 MG/250ML-% EP SOLN: 12.0000 mL/h | EPIDURAL | Status: DC | PRN

## 2020-04-06 MED ORDER — PHENYLEPHRINE 40 MCG/ML (10ML) SYRINGE FOR IV PUSH (FOR BLOOD PRESSURE SUPPORT): 80.0000 ug | PREFILLED_SYRINGE | INTRAVENOUS | Status: DC | PRN

## 2020-04-06 MED ORDER — LACTATED RINGERS IV SOLN: 500.0000 mL | Freq: Once | INTRAVENOUS | Status: DC

## 2020-04-06 MED ORDER — ACETAMINOPHEN 325 MG PO TABS: 650.0000 mg | ORAL_TABLET | ORAL | Status: DC | PRN

## 2020-04-06 MED ORDER — DIPHENHYDRAMINE HCL 25 MG PO CAPS: 25.0000 mg | ORAL_CAPSULE | Freq: Four times a day (QID) | ORAL | Status: DC | PRN

## 2020-04-06 MED ORDER — MEASLES, MUMPS & RUBELLA VAC IJ SOLR: 0.5000 mL | Freq: Once | INTRAMUSCULAR | Status: DC

## 2020-04-06 MED ORDER — DIBUCAINE (PERIANAL) 1 % EX OINT: 1.0000 "application " | TOPICAL_OINTMENT | CUTANEOUS | Status: DC | PRN

## 2020-04-06 MED ORDER — METRONIDAZOLE 500 MG PO TABS
2000.0000 mg | ORAL_TABLET | Freq: Once | ORAL | Status: AC
  Administered 2020-04-06: 2000 mg via ORAL
  Filled 2020-04-06: qty 4

## 2020-04-06 MED ORDER — METHYLERGONOVINE MALEATE 0.2 MG PO TABS: 0.2000 mg | ORAL_TABLET | ORAL | Status: DC | PRN

## 2020-04-06 MED ORDER — LIDOCAINE HCL (PF) 1 % IJ SOLN
INTRAMUSCULAR | Status: DC | PRN
  Administered 2020-04-06: 2 mL via EPIDURAL
  Administered 2020-04-06: 10 mL via EPIDURAL

## 2020-04-06 MED ORDER — TETANUS-DIPHTH-ACELL PERTUSSIS 5-2.5-18.5 LF-MCG/0.5 IM SUSY: 0.5000 mL | PREFILLED_SYRINGE | Freq: Once | INTRAMUSCULAR | Status: DC

## 2020-04-06 MED ORDER — SIMETHICONE 80 MG PO CHEW: 80.0000 mg | CHEWABLE_TABLET | ORAL | Status: DC | PRN

## 2020-04-06 MED ORDER — FLEET ENEMA 7-19 GM/118ML RE ENEM: 1.0000 | ENEMA | Freq: Every day | RECTAL | Status: DC | PRN

## 2020-04-06 MED ORDER — COCONUT OIL OIL: 1.0000 "application " | TOPICAL_OIL | Status: DC | PRN

## 2020-04-06 MED ORDER — IBUPROFEN 600 MG PO TABS
600.0000 mg | ORAL_TABLET | Freq: Four times a day (QID) | ORAL | Status: DC
  Administered 2020-04-06 – 2020-04-07 (×5): 600 mg via ORAL
  Filled 2020-04-06 (×5): qty 1

## 2020-04-06 MED ORDER — BENZOCAINE-MENTHOL 20-0.5 % EX AERO: 1.0000 "application " | INHALATION_SPRAY | CUTANEOUS | Status: DC | PRN

## 2020-04-06 MED ORDER — WITCH HAZEL-GLYCERIN EX PADS: 1.0000 "application " | MEDICATED_PAD | CUTANEOUS | Status: DC | PRN

## 2020-04-06 MED ORDER — ONDANSETRON HCL 4 MG PO TABS: 4.0000 mg | ORAL_TABLET | ORAL | Status: DC | PRN

## 2020-04-06 MED ORDER — MISOPROSTOL 50MCG HALF TABLET
50.0000 ug | ORAL_TABLET | ORAL | Status: DC | PRN
  Administered 2020-04-06: 50 ug via ORAL
  Filled 2020-04-06: qty 1

## 2020-04-06 MED ORDER — DIPHENHYDRAMINE HCL 50 MG/ML IJ SOLN: 12.5000 mg | INTRAMUSCULAR | Status: DC | PRN

## 2020-04-06 NOTE — Anesthesia Preprocedure Evaluation (Signed)
Anesthesia Evaluation  Patient identified by MRN, date of birth, ID band Patient awake    Reviewed: Allergy & Precautions, Patient's Chart, lab work & pertinent test results  Airway Mallampati: II  TM Distance: >3 FB Neck ROM: Full    Dental no notable dental hx.    Pulmonary former smoker,    Pulmonary exam normal breath sounds clear to auscultation       Cardiovascular negative cardio ROS Normal cardiovascular exam Rhythm:Regular Rate:Normal     Neuro/Psych PSYCHIATRIC DISORDERS negative neurological ROS     GI/Hepatic Neg liver ROS, GERD  Medicated and Controlled,  Endo/Other  negative endocrine ROS  Renal/GU negative Renal ROS  negative genitourinary   Musculoskeletal negative musculoskeletal ROS (+)   Abdominal   Peds negative pediatric ROS (+)  Hematology  (+) Blood dyscrasia, anemia , hct 32.8, plt 318   Anesthesia Other Findings   Reproductive/Obstetrics (+) Pregnancy                             Anesthesia Physical Anesthesia Plan  ASA: II and emergent  Anesthesia Plan: Epidural   Post-op Pain Management:    Induction:   PONV Risk Score and Plan: 2  Airway Management Planned: Natural Airway  Additional Equipment: None  Intra-op Plan:   Post-operative Plan:   Informed Consent: I have reviewed the patients History and Physical, chart, labs and discussed the procedure including the risks, benefits and alternatives for the proposed anesthesia with the patient or authorized representative who has indicated his/her understanding and acceptance.       Plan Discussed with:   Anesthesia Plan Comments:         Anesthesia Quick Evaluation

## 2020-04-06 NOTE — Telephone Encounter (Signed)
Left message with female for patient to call the office regarding PP visit appointment and 1 wk BP check.  Clovis Pu, RN

## 2020-04-06 NOTE — Lactation Note (Signed)
This note was copied from a baby's chart. Lactation Consultation Note  Patient Name: Girl Demitra Danley [id number] Date: 04/06/2020 Reason for consult: Follow-up assessment;Early term 37-38.6wks;Infant < 6lbs Age:23 hours   2nd LC visit this afternoon.  LC set mom up with the  DEBP - see below. Per mom comfortable.  Maternal Data Has patient been taught Hand Expression?: Yes  Feeding Feeding Type:  (last fed at 1400 - formula) Nipple Type: Slow - flow  LATCH Score                   Interventions Interventions: Breast feeding basics reviewed;DEBP  Lactation Tools Discussed/Used Tools: Pump;Flanges Flange Size: 24 Breast pump type: Double-Electric Breast Pump WIC Program:  (LC faxed a referral 1/11) Pump Education: Milk Storage;Setup, frequency, and cleaning Initiated by:: MAI Date initiated:: 04/06/20   Consult Status Consult Status: Follow-up Date: 04/07/20 Follow-up type: In-patient    [name] Terance Pomplun 04/06/2020, 4:19 PM

## 2020-04-06 NOTE — Lactation Note (Signed)
This note was copied from a baby's chart. Lactation Consultation Note  Patient Name: Wendy Smith [id number] Date: 04/06/2020 Reason for consult: L&D Initial assessment;Primapara Age:25 years  LC to L&D for bf assistance approximately 1 hour p delivery. Mom and baby were sts and mother reported that baby bf earlier on both sides. Baby was cuing and independently re-latched during visit. LC reviewed feeding behavior in the 1st 24 hours and encouraged mother to feed with cues. Discouraged pacifier use in the 1st 2-3 weeks while mother and baby learn to bf. Mom denies significant breast hx. She is aware of LC services p transfer to [facility]. Patient was provided with the opportunity to ask questions. All concerns were addressed. Will plan follow up visit.    Consult Status Consult Status: Follow-up Follow-up type: In-patient   Elder Negus, MA IBCLC 04/06/2020, 7:55 AM

## 2020-04-06 NOTE — Lactation Note (Addendum)
This note was copied from a baby's chart. Lactation Consultation Note  Patient Name: Wendy Smith IFOYD'X Date: 04/06/2020 Reason for consult: Initial assessment;Infant < 6lbs;Early term 37-38.6wks Age:23 hours  LC visited mom in her room and per mom baby had to go to the nursery due not holding temp to warm up.  LC updated the doc flow sheets per mom. Mom mentioned she felt more comfortable knowing the baby was being fed when giving a bottle , but still wanted to breast feed.  LC reassured mom she will be able to breast and formula.  LC reviewed the recommended LC plan for Early term, less than 6 pounds.  Feed with feeding cues and if the baby not showing feeding cues by 3 hours , STS.  LC reviewed 2 feeding options, - offer the breast 1st , if baby latches feed for 15 -20 mins ( 30 mins mx ) and supplement afterwards using supplementing guidelines, and post pump both breast for 15 mins.  2nd option - if baby is to sluggish to latch - try feeding the baby and appetizer of EBM or formula, and then latch, supplement afterwards and post pump.  Per mom is not active with [facility] and desires to be active.  LC completed the [facility] referral and sent to [facility] .   Mom encouraged to call when back in her room so a DEBP can be set up.    Maternal Data    Feeding Feeding Type:  (last fed at 1400 - formula) Nipple Type: Slow - flow  LATCH Score                   Interventions Interventions: Breast feeding basics reviewed  Lactation Tools Discussed/Used WIC Program: No   Consult Status Consult Status: Follow-up Date: 04/06/20 Follow-up type: In-patient    Wendy Smith Wendy Smith 04/06/2020, 2:52 PM

## 2020-04-06 NOTE — Anesthesia Postprocedure Evaluation (Signed)
Anesthesia Post Note  Patient: Wendy Smith  Procedure(s) Performed: AN AD HOC LABOR EPIDURAL     Patient location during evaluation: Mother Baby Anesthesia Type: Epidural Level of consciousness: awake and alert Pain management: pain level controlled Vital Signs Assessment: post-procedure vital signs reviewed and stable Respiratory status: spontaneous breathing, nonlabored ventilation and respiratory function stable Cardiovascular status: stable Postop Assessment: no headache, no backache and epidural receding Anesthetic complications: no   No complications documented.  Last Vitals:  Vitals:   04/06/20 0930 04/06/20 1023  BP: 124/80 132/87  Pulse: 81 76  Resp: 20   Temp:  36.5 C  SpO2: 99% 100%    Last Pain:  Vitals:   04/06/20 1023  TempSrc: Oral  PainSc:    Pain Goal:                Epidural/Spinal Function Cutaneous sensation: Normal sensation (04/06/20 1023), Patient able to flex knees: Yes (04/06/20 1023), Patient able to lift hips off bed: Yes (04/06/20 1023), Back pain beyond tenderness at insertion site: No (04/06/20 1023), Progressively worsening motor and/or sensory loss: No (04/06/20 1023), Bowel and/or bladder incontinence post epidural: No (04/06/20 1023)  Thiago Ragsdale

## 2020-04-06 NOTE — Anesthesia Procedure Notes (Signed)
Epidural Patient location during procedure: OB Start time: 04/06/2020 2:28 AM End time: 04/06/2020 2:39 AM  Staffing Anesthesiologist: Lannie Fields, DO Performed: anesthesiologist   Preanesthetic Checklist Completed: patient identified, IV checked, risks and benefits discussed, monitors and equipment checked, pre-op evaluation and timeout performed  Epidural Patient position: sitting Prep: DuraPrep and site prepped and draped Patient monitoring: continuous pulse ox, blood pressure, heart rate and cardiac monitor Approach: midline Location: L3-L4 Injection technique: LOR air  Needle:  Needle type: Tuohy  Needle gauge: 17 G Needle length: 9 cm Needle insertion depth: 8 cm Catheter type: closed end flexible Catheter size: 19 Gauge Catheter at skin depth: 13 cm Test dose: negative  Assessment Sensory level: T8 Events: blood not aspirated, injection not painful, no injection resistance, no paresthesia and negative IV test  Additional Notes Patient identified. Risks/Benefits/Options discussed with patient including but not limited to bleeding, infection, nerve damage, paralysis, failed block, incomplete pain control, headache, blood pressure changes, nausea, vomiting, reactions to medication both or allergic, itching and postpartum back pain. Confirmed with bedside nurse the patient's most recent platelet count. Confirmed with patient that they are not currently taking any anticoagulation, have any bleeding history or any family history of bleeding disorders. Patient expressed understanding and wished to proceed. All questions were answered. Sterile technique was used throughout the entire procedure. Please see nursing notes for vital signs. Test dose was given through epidural catheter and negative prior to continuing to dose epidural or start infusion. Warning signs of high block given to the patient including shortness of breath, tingling/numbness in hands, complete motor  block, or any concerning symptoms with instructions to call for help. Patient was given instructions on fall risk and not to get out of bed. All questions and concerns addressed with instructions to call with any issues or inadequate analgesia.  Reason for block:procedure for pain
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
## 2020-04-06 NOTE — Discharge Summary (Addendum)
Postpartum Discharge Summary  Patient Name: Wendy Smith DOB: 1995-10-16 MRN: [id number]  Date of admission: 04/05/2020 Delivery date:04/06/2020  Delivering provider: Christin Fudge  Date of discharge: 04/07/2020  Admitting diagnosis: Encounter for induction of labor [Z34.90] Intrauterine pregnancy: [redacted]w[redacted]d    Secondary diagnosis:  Active Problems:   Encounter for induction of labor   Trichomoniasis  Additional problems: gestational hypertension in labor    Discharge diagnosis: Term Pregnancy Delivered                                              Post partum procedures:rhogam Augmentation: Cytotec Complications: None  Hospital course: Onset of Labor With Vaginal Delivery      25y.o. yo G2P1011 at 331w1das admitted in Latent Labor on 04/05/2020. Patient had an uncomplicated labor course as follows:  Membrane Rupture Time/Date:  ,   Delivery Method:Vaginal, Spontaneous  Episiotomy: None  Lacerations:  1st degree;Labial  Patient had an uncomplicated postpartum course.  She is ambulating, tolerating a regular diet, passing flatus, and urinating well. Patient is discharged home in stable condition on 04/07/20.  Newborn Data: Birth date:04/06/2020  Birth time:6:46 AM  Gender:Female  Living status:Living  Apgars:8 ,9  Weight:2325 g   Magnesium Sulfate received: No BMZ received: No Rhophylac:Yes MMR:N/A T-DaP:Given prenatally Flu: N/A- given prenatally Transfusion:No  Physical exam  Vitals:   04/06/20 1405 04/06/20 1838 04/06/20 2317 04/07/20 0506  BP: 113/73 119/67 123/70 127/85  Pulse: 69 84 80 87  Resp: 18 18 20 16   Temp: 97.9 F (36.6 C) 98.1 F (36.7 C) 98.2 F (36.8 C) 98 F (36.7 C)  TempSrc: Oral  Oral Oral  SpO2: 100% 98% 100% 100%  Weight:      Height:       General: alert, cooperative and no distress Lochia: appropriate Uterine Fundus: firm Incision: N/A DVT Evaluation: No evidence of DVT seen on physical exam. Labs: Lab Results   Component Value Date   WBC 10.1 04/05/2020   HGB 10.1 (L) 04/05/2020   HCT 32.8 (L) 04/05/2020   MCV 85.6 04/05/2020   PLT 318 04/05/2020   CMP Latest Ref Rng & Units 04/06/2020  Glucose 70 - 99 mg/dL 81  BUN 6 - 20 mg/dL 10  Creatinine 0.44 - 1.00 mg/dL 0.60  Sodium 135 - 145 mmol/L 138  Potassium 3.5 - 5.1 mmol/L 3.5  Chloride 98 - 111 mmol/L 109  CO2 22 - 32 mmol/L 20(L)  Calcium 8.9 - 10.3 mg/dL 9.1  Total Protein 6.5 - 8.1 g/dL 6.3(L)  Total Bilirubin 0.3 - 1.2 mg/dL 0.2(L)  Alkaline Phos 38 - 126 U/L 136(H)  AST 15 - 41 U/L 17  ALT 0 - 44 U/L 13   Edinburgh Score: Edinburgh Postnatal Depression Scale Screening Tool 04/07/2020  I have been able to laugh and see the funny side of things. 0  I have looked forward with enjoyment to things. 0  I have blamed myself unnecessarily when things went wrong. 1  I have been anxious or worried for no good reason. 0  I have felt scared or panicky for no good reason. 0  Things have been getting on top of me. 0  I have been so unhappy that I have had difficulty sleeping. 0  I have felt sad or miserable. 0  I have been so unhappy that I have  been crying. 0  The thought of harming myself has occurred to me. 0  Edinburgh Postnatal Depression Scale Total 1     After visit meds:  Allergies as of 04/07/2020   No Known Allergies      Medication List     TAKE these medications    acetaminophen 325 MG tablet Commonly known as: Tylenol Take 2 tablets (650 mg total) by mouth every 4 (four) hours as needed (for pain scale < 4).   ascorbic acid 500 MG tablet Commonly known as: VITAMIN C Take 1 tablet (500 mg total) by mouth every other day. Twice a day every other day with Iron Tablet   famotidine 20 MG tablet Commonly known as: Pepcid Take 1 tablet (20 mg total) by mouth 2 (two) times daily.   ferrous sulfate 325 (65 FE) MG tablet Commonly known as: FerrouSul Take 1 tablet (325 mg total) by mouth every other day. Twice a day  every other day with Vitamin C tablet   ibuprofen 600 MG tablet Commonly known as: ADVIL Take 1 tablet (600 mg total) by mouth every 6 (six) hours.   norethindrone 0.35 MG tablet Commonly known as: Ortho Micronor Take 1 tablet (0.35 mg total) by mouth daily.   PRENATAL ADULT GUMMY/DHA/FA PO Take 3 each by mouth daily.         Discharge home in stable condition Infant Feeding: Breast Infant Disposition:home with mother Discharge instruction: per After Visit Summary and Postpartum booklet. Activity: Advance as tolerated. Pelvic rest for 6 weeks.  Diet: routine diet Future Appointments: Future Appointments  Date Time Provider [place]  04/13/2020  2:30 PM [place] None  05/06/2020  2:30 PM Laury Deep, CNM [facility] None   Follow up Visit:    Please schedule this patient for a In person postpartum visit in 4 weeks with the following provider: Any provider. Additional Postpartum F/U:BP check 1 week and POC trich at [facility] visit Low risk pregnancy complicated by: HTN Delivery mode:  Vaginal, Spontaneous  Anticipated Birth Control:  POPs   04/07/2020 Gladys Damme, MD  I spoke with and examined patient and agree with resident/PA-S/MS/SNM's note and plan of care. BPs normal. Reviewed pre-e s/s, reasons to seek care. F/U 1wk for bp check.  Roma Schanz, CNM, [facility] 04/13/2020 5:17 PM

## 2020-04-07 MED ORDER — NORETHINDRONE 0.35 MG PO TABS: 1.0000 | ORAL_TABLET | Freq: Every day | ORAL | 11 refills | Status: DC

## 2020-04-07 MED ORDER — RHO D IMMUNE GLOBULIN 1500 UNIT/2ML IJ SOSY
300.0000 ug | PREFILLED_SYRINGE | Freq: Once | INTRAMUSCULAR | Status: AC
  Administered 2020-04-07: 300 ug via INTRAVENOUS
  Filled 2020-04-07: qty 2

## 2020-04-07 MED ORDER — ACETAMINOPHEN 325 MG PO TABS: 650.0000 mg | ORAL_TABLET | ORAL | 0 refills | Status: DC | PRN

## 2020-04-07 MED ORDER — IBUPROFEN 600 MG PO TABS: 600.0000 mg | ORAL_TABLET | Freq: Four times a day (QID) | ORAL | 0 refills | Status: DC

## 2020-04-07 NOTE — Lactation Note (Signed)
This note was copied from a baby's chart. Lactation Consultation Note Baby 22 hrs old at time of consult. Mom latching baby on the breast when LC came into rm. Mom stated baby is BF good. Mom BF in cradle position. LC suggested chin tug to widen flange. Baby had good body position.  Mom is supplementing w/formula after BF.  Encouraged mom to pump more. Mom didn't pump during the night.discussed importance of pumping and why. Mom stated she got a little colostrum yesterday.  Encouraged to call for assistance or questions.  Patient Name: Wendy Smith [id number] Date: 04/07/2020 Reason for consult: Early term 37-38.6wks;Infant < 6lbs Age:53 hours  Maternal Data    Feeding Feeding Type: Breast Fed Nipple Type: Slow - flow  LATCH Score Latch: Grasps breast easily, tongue down, lips flanged, rhythmical sucking.  Audible Swallowing: A few with stimulation  Type of Nipple: Everted at rest and after stimulation  Comfort (Breast/Nipple): Soft / non-tender  Hold (Positioning): No assistance needed to correctly position infant at breast.  LATCH Score: 9  Interventions Interventions: Assisted with latch;Breast feeding basics reviewed;Breast massage;Breast compression  Lactation Tools Discussed/Used Breast pump type: Double-Electric Breast Pump   Consult Status Consult Status: Follow-up Date: 04/08/20 Follow-up type: In-patient    Charyl Dancer 04/07/2020, 6:11 AM

## 2020-04-07 NOTE — Telephone Encounter (Signed)
Left message with female for patient to call the office for appointments.  Clovis Pu, RN

## 2020-04-07 NOTE — Progress Notes (Signed)
AVS printed and discharged instructions given to patient. Patient is aware of follow-up appoint and nurse visit, and is instructed to pick up prescriptions. All questions answered and pt verbalized understanding.

## 2020-04-08 ENCOUNTER — Encounter: Payer: Medicaid Other | Admitting: Obstetrics and Gynecology

## 2020-04-08 LAB — RH IG WORKUP (INCLUDES ABO/RH)
ABO/RH(D): O NEG
Fetal Screen: NEGATIVE
Gestational Age(Wks): 38
Unit division: 0

## 2020-04-12 ENCOUNTER — Encounter (HOSPITAL_COMMUNITY): Payer: Self-pay | Admitting: Family Medicine

## 2020-04-12 ENCOUNTER — Inpatient Hospital Stay (HOSPITAL_COMMUNITY)
Admission: AD | Admit: 2020-04-12 | Discharge: 2020-04-12 | Disposition: A | Discharge status: Home / Self Care | Payer: Medicaid Other | Attending: Family Medicine | Admitting: Family Medicine

## 2020-04-12 ENCOUNTER — Inpatient Hospital Stay (HOSPITAL_COMMUNITY): Payer: Medicaid Other

## 2020-04-12 DIAGNOSIS — O8612 Endometritis following delivery: Secondary | ICD-10-CM | POA: Diagnosis not present

## 2020-04-12 DIAGNOSIS — Z87891 Personal history of nicotine dependence: Secondary | ICD-10-CM | POA: Insufficient documentation

## 2020-04-12 DIAGNOSIS — N939 Abnormal uterine and vaginal bleeding, unspecified: Secondary | ICD-10-CM

## 2020-04-12 LAB — CBC
HCT: 31.5 % — ABNORMAL LOW (ref 36.0–46.0)
Hemoglobin: 9.5 g/dL — ABNORMAL LOW (ref 12.0–15.0)
MCH: 26 pg (ref 26.0–34.0)
MCHC: 30.2 g/dL (ref 30.0–36.0)
MCV: 86.3 fL (ref 80.0–100.0)
Platelets: 403 10*3/uL — ABNORMAL HIGH (ref 150–400)
RBC: 3.65 MIL/uL — ABNORMAL LOW (ref 3.87–5.11)
RDW: 14 % (ref 11.5–15.5)
WBC: 7.2 10*3/uL (ref 4.0–10.5)
nRBC: 0 % (ref 0.0–0.2)

## 2020-04-12 MED ORDER — METHYLERGONOVINE MALEATE 0.2 MG/ML IJ SOLN
0.2000 mg | Freq: Once | INTRAMUSCULAR | Status: AC
  Administered 2020-04-12: 0.2 mg via INTRAMUSCULAR
  Filled 2020-04-12: qty 1

## 2020-04-12 MED ORDER — MISOPROSTOL 200 MCG PO TABS: ORAL_TABLET | ORAL | 0 refills | Status: DC

## 2020-04-12 MED ORDER — AMOXICILLIN-POT CLAVULANATE 875-125 MG PO TABS: 1.0000 | ORAL_TABLET | Freq: Two times a day (BID) | ORAL | 0 refills | Status: AC

## 2020-04-12 NOTE — MAU Note (Signed)
EMS arriaval. Pt had a NSVD on 04/06/20. Pt started having heavy bleeding this morning passed 2 baseball sized clots and had to change pad 3 times . Denies any dizziness . Denies problems during pregnancy.No pain or cramping with bleeding.

## 2020-04-12 NOTE — Discharge Instructions (Signed)
[employer] Clinical Advisor 2018 ([phone]. [phone]). Elsevier.">  Endometritis  Endometritis is an irritation, soreness, or inflammation that affects the lining of the uterus (endometrium). It is often caused by an infection. It is vital to get treatment to prevent complications. Common complications may include more severe infections and not being able to have children(infertility). What are the causes? This condition may be caused by:  Bacterial infections.  STIs (sexually transmitted infections).  A miscarriage or giving birth, especially after a long labor or cesarean delivery.  Certain procedures affecting the female reproductive tract. These may include dilation and curettage (D&C), hysteroscopy, or birth control (contraceptive) insertion.  Tuberculosis (TB). What increases the risk? The following factors may make you more likely to develop this condition:  Having multiple sexual partners.  Not using barrier protection during sex, such as a condom.  Sexual activity before age 16. What are the signs or symptoms? Symptoms of this condition include:  Fever.  Pain in your lower abdomen.  Pain in the area between your hip bones (pelvis).  Vaginal discharge or bleeding that is not normal.  Abdominal bloating or swelling.  General feeling of discomfort or feeling ill.  Discomfort with bowel movements or constipation. How is this diagnosed? This condition may be diagnosed based on:  A physical exam, including a pelvic exam.  Tests, such as: ? Blood tests. ? Taking a sample of endometrial tissue for testing (endometrial biopsy). ? Examining a sample of vaginal discharge under a microscope (wet prep). ? Removing a sample of fluid from the cervix for testing (cervical culture). ? Surgical exam of the pelvis and abdomen. How is this treated? This condition is treated with antibiotic medicines. For more severe cases, fluids and antibiotics may be given through an IV at a  hospital. Follow these instructions at home: Medicines  Take over-the-counter and prescription medicines only as told by your health care provider.  Take your antibiotic medicine as told by your health care provider. Do not stop taking the antibiotic even if you start to feel better. Activity  Do not douche or have sex until your health care provider says it is safe.  If this condition was caused by an STI, do nothave sex until your partner has also been treated for the STI.  Return to your normal activities as told by your health care provider. Ask your health care provider what activities are safe for you. General instructions  Drink enough fluid to keep your urine pale yellow.  Keep all follow-up visits. This is important. Contact a health care provider if:  You have pain that does not get better with medicine.  You have a fever.  You have pain with bowel movements. Get help right away if:  You have swelling in your abdomen.  You have pain in your abdomen that gets worse.  You have a vaginal discharge that smells bad, or you have an increased amount of discharge.  You have vaginal bleeding that is not normal.  You have nausea and vomiting. Summary  Endometritis affects the lining of the uterus (endometrium). It is often caused by an infection.  It is vital to get treatment to prevent complications. Treatment may include antibiotics and IV fluids.  Take your antibiotic medicine as told by your health care provider. Do not stop taking the antibiotic even if you start to feel better.  Do not douche or have sex until your health care provider says it is safe. This information is not intended to replace advice given   to you by your health care provider. Make sure you discuss any questions you have with your health care provider. Document Revised: 10/15/2019 Document Reviewed: 10/15/2019 Elsevier Patient Education  2021 Else[employer] Inc.  
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
## 2020-04-13 ENCOUNTER — Telehealth: Payer: Self-pay | Admitting: *Deleted

## 2020-04-13 ENCOUNTER — Other Ambulatory Visit: Payer: Self-pay | Admitting: Certified Nurse Midwife

## 2020-04-13 ENCOUNTER — Ambulatory Visit: Payer: Medicaid Other

## 2020-04-13 DIAGNOSIS — A599 Trichomoniasis, unspecified: Secondary | ICD-10-CM

## 2020-04-13 MED ORDER — METRONIDAZOLE 500 MG PO TABS: 500.0000 mg | ORAL_TABLET | Freq: Two times a day (BID) | ORAL | 0 refills | Status: DC

## 2020-04-13 NOTE — Telephone Encounter (Signed)
Left voice message for patient to call the office regarding appointment that need to be scheduled for this Friday 04/16/20 regarding MAU follow up. Appointment scheduled 04/16/20 at 10:50 AM.  Clovis Pu

## 2020-04-13 NOTE — Telephone Encounter (Signed)
-----   Message from Marylen Ponto, NP sent at 04/12/2020  6:51 PM EST ----- Regarding: [facility] Appointment Good morning,  This patient was seen in MAU and diagnosed with postpartum endometritis. Per Dr. Despina Hidden, this patient needs to be seen in the office for a visit on Friday of this week. Please call her to schedule.  Thank you, Joni Reining

## 2020-04-13 NOTE — Progress Notes (Signed)
Treatment for trichomonas sent to pharmacy in error, pt treated in labor

## 2020-04-15 ENCOUNTER — Encounter: Payer: Medicaid Other | Admitting: Obstetrics and Gynecology

## 2020-04-15 NOTE — Telephone Encounter (Signed)
Left message with [name], Father of baby's mother to have patient call clinic regarding setting up appointments.  Clovis Pu, RN

## 2020-04-16 ENCOUNTER — Ambulatory Visit: Payer: Medicaid Other

## 2020-05-06 ENCOUNTER — Ambulatory Visit: Payer: Medicaid Other | Admitting: Obstetrics and Gynecology

## 2020-05-13 ENCOUNTER — Other Ambulatory Visit: Payer: Self-pay

## 2020-05-13 ENCOUNTER — Encounter: Payer: Self-pay | Admitting: Obstetrics and Gynecology

## 2020-05-13 ENCOUNTER — Other Ambulatory Visit (HOSPITAL_COMMUNITY)
Admission: RE | Admit: 2020-05-13 | Discharge: 2020-05-13 | Disposition: A | Discharge status: Home / Self Care | Payer: Medicaid Other | Source: Ambulatory Visit | Attending: Obstetrics and Gynecology | Admitting: Obstetrics and Gynecology

## 2020-05-13 ENCOUNTER — Ambulatory Visit (INDEPENDENT_AMBULATORY_CARE_PROVIDER_SITE_OTHER): Payer: Medicaid Other | Admitting: Obstetrics and Gynecology

## 2020-05-13 DIAGNOSIS — Z01419 Encounter for gynecological examination (general) (routine) without abnormal findings: Secondary | ICD-10-CM

## 2020-05-13 DIAGNOSIS — Z113 Encounter for screening for infections with a predominantly sexual mode of transmission: Secondary | ICD-10-CM

## 2020-05-13 NOTE — Progress Notes (Signed)
Post Partum Visit Note  Wendy Smith is a 25 y.o. G41P1011 female who presents for a postpartum visit. She is 7 weeks postpartum following a normal spontaneous vaginal delivery.  I have fully reviewed the prenatal and intrapartum course. The delivery was at 38.1 gestational weeks.  Anesthesia: epidural. Postpartum course has been complicated with delayed PPH (Passing "very large clots". She has had no complicatons now. Baby is doing well; now weighs 6.5 lbs. Baby is feeding by bottle - Gerber Gentle Pro. Bleeding no bleeding. Bowel function is normal. Bladder function is normal. Patient is sexually active "a couple of days ago." Contraception method is none. Postpartum depression screening: negative. She reports "loving being a mother."   The pregnancy intention screening data noted above was reviewed. Potential methods of contraception were discussed. The patient elected to proceed with Hormonal Injection.    Edinburgh Postnatal Depression Scale - 05/13/20 1501      Edinburgh Postnatal Depression Scale:  In the Past 7 Days   I have been able to laugh and see the funny side of things. 0    I have looked forward with enjoyment to things. 0    I have blamed myself unnecessarily when things went wrong. 0    I have been anxious or worried for no good reason. 0    I have felt scared or panicky for no good reason. 0    Things have been getting on top of me. 0    I have been so unhappy that I have had difficulty sleeping. 0    I have felt sad or miserable. 0    I have been so unhappy that I have been crying. 0    The thought of harming myself has occurred to me. 0    Edinburgh Postnatal Depression Scale Total 0         The following portions of the patient's history were reviewed and updated as appropriate: allergies, current medications, past family history, past medical history, past social history, past surgical history and problem list.  Review of Systems Constitutional: negative Eyes:  negative Ears, nose, mouth, throat, and face: negative Respiratory: negative Cardiovascular: negative Gastrointestinal: negative Genitourinary:negative Integument/breast: negative Hematologic/lymphatic: negative Musculoskeletal:negative Neurological: negative Behavioral/Psych: negative Endocrine: negative Allergic/Immunologic: negative    Objective:  BP 131/88 (BP Location: Left Arm, Patient Position: Sitting, Cuff Size: Normal)   Pulse 88   Temp 98.6 F (37 C) (Oral)   Ht 5\' 8"  (1.727 m)   Wt 157 lb 9.6 oz (71.5 kg)   Breastfeeding No   BMI 23.96 kg/m    General:  alert, cooperative, appears stated age and no distress   Breasts:  inspection negative, no nipple discharge or bleeding, no masses or nodularity palpable  Lungs: clear to auscultation bilaterally  Heart:  regular rate and rhythm, S1, S2 normal, no murmur, click, rub or gallop  Abdomen: soft, non-tender; bowel sounds normal; no masses,  no organomegaly   Vulva:  normal  Vagina: normal vagina  Cervix:  anteverted and some bleedng following pap  Corpus: normal size, contour, position, consistency, mobility, non-tender  Adnexa:  normal adnexa  Rectal Exam: Not performed.        Assessment:  1. Encounter for postpartum visit - Normal postpartum exam. Pap smear done at today's visit.  - Cytology - PAP( [place])  2. Screen for STD (sexually transmitted disease)  - Cervicovaginal ancillary only( )    Plan:   Essential components of care per  ACOG recommendations:  1.  Mood and well being: Patient with negative depression screening today. Reviewed local resources for support.  - Patient does not use tobacco.  - hx of drug use? No    2. Infant care and feeding:  -Patient currently breastmilk feeding? No  -Social determinants of health (SDOH) reviewed in EPIC. No concerns  3. Sexuality, contraception and birth spacing - Patient does not want a pregnancy in the next year.  Desired family size is  1 children.  - Reviewed forms of contraception in tiered fashion. Patient desired Depo-Provera today.   - Discussed birth spacing of 18 months  4. Sleep and fatigue -Encouraged family/partner/community support of 4 hrs of uninterrupted sleep to help with mood and fatigue  5. Physical Recovery  - Discussed patients delivery and complications - Patient had a 1st degree labial laceration, perineal healing reviewed. Patient expressed understanding - Patient has urinary incontinence? No - Patient is safe to resume physical and sexual activity -- needs to abstain from SI for 2 weeks for Depo injection  6.  Health Maintenance - Last pap smear done unknown and was unknown. Done today.  7. No Chronic Disease - PCP follow up  Raelyn Mora, CNM [facility] [employer], [facility]

## 2020-05-14 LAB — CERVICOVAGINAL ANCILLARY ONLY
Bacterial Vaginitis (gardnerella): POSITIVE — AB
Candida Glabrata: NEGATIVE
Candida Vaginitis: NEGATIVE
Chlamydia: NEGATIVE
Comment: NEGATIVE
Comment: NEGATIVE
Comment: NEGATIVE
Comment: NEGATIVE
Comment: NEGATIVE
Comment: NORMAL
Neisseria Gonorrhea: NEGATIVE
Trichomonas: POSITIVE — AB

## 2020-05-14 LAB — CYTOLOGY - PAP: Diagnosis: NEGATIVE

## 2020-05-15 ENCOUNTER — Other Ambulatory Visit: Payer: Self-pay | Admitting: Obstetrics and Gynecology

## 2020-05-15 DIAGNOSIS — A599 Trichomoniasis, unspecified: Secondary | ICD-10-CM

## 2020-05-15 MED ORDER — METRONIDAZOLE 500 MG PO TABS: 500.0000 mg | ORAL_TABLET | Freq: Two times a day (BID) | ORAL | 0 refills | Status: DC

## 2020-05-17 ENCOUNTER — Telehealth: Payer: Self-pay | Admitting: *Deleted

## 2020-05-17 NOTE — Telephone Encounter (Signed)
Left voice message for patient to return nurse call regarding test results.  Krystian Ferrentino L, RN  

## 2020-05-17 NOTE — Telephone Encounter (Signed)
-----   Message from Raelyn Mora, [place] sent at 05/15/2020 11:33 AM EST ----- (+) BV

## 2020-05-19 NOTE — Telephone Encounter (Signed)
Patient has been informed of test results and advised to informed her partners of Trich infection and to not have sexual intercourse for at least 7-14 days until both her and partner have been treated. TOC schedule for 06/17/2020. Partner can be treated at the health department.

## 2020-05-27 ENCOUNTER — Ambulatory Visit: Payer: Medicaid Other

## 2020-06-17 ENCOUNTER — Ambulatory Visit: Payer: Medicaid Other

## 2022-08-07 ENCOUNTER — Other Ambulatory Visit: Payer: Self-pay

## 2022-08-07 ENCOUNTER — Ambulatory Visit (HOSPITAL_COMMUNITY)
Admission: EM | Admit: 2022-08-07 | Discharge: 2022-08-07 | Disposition: A | Discharge status: Other Acute Inpatient Hospital | Payer: Medicaid Other | Attending: Psychiatry | Admitting: Psychiatry

## 2022-08-07 ENCOUNTER — Inpatient Hospital Stay (HOSPITAL_COMMUNITY)
Admission: AD | Admit: 2022-08-07 | Discharge: 2022-08-11 | DRG: 885 | Disposition: A | Discharge status: Home / Self Care | Payer: Medicaid Other | Source: Intra-hospital | Attending: Psychiatry | Admitting: Psychiatry

## 2022-08-07 DIAGNOSIS — Z62810 Personal history of physical and sexual abuse in childhood: Secondary | ICD-10-CM | POA: Insufficient documentation

## 2022-08-07 DIAGNOSIS — Z560 Unemployment, unspecified: Secondary | ICD-10-CM

## 2022-08-07 DIAGNOSIS — Z79899 Other long term (current) drug therapy: Secondary | ICD-10-CM

## 2022-08-07 DIAGNOSIS — F411 Generalized anxiety disorder: Secondary | ICD-10-CM | POA: Diagnosis present

## 2022-08-07 DIAGNOSIS — R45851 Suicidal ideations: Secondary | ICD-10-CM | POA: Insufficient documentation

## 2022-08-07 DIAGNOSIS — Z9151 Personal history of suicidal behavior: Secondary | ICD-10-CM | POA: Insufficient documentation

## 2022-08-07 DIAGNOSIS — G4700 Insomnia, unspecified: Secondary | ICD-10-CM | POA: Diagnosis present

## 2022-08-07 DIAGNOSIS — F419 Anxiety disorder, unspecified: Secondary | ICD-10-CM | POA: Insufficient documentation

## 2022-08-07 DIAGNOSIS — F322 Major depressive disorder, single episode, severe without psychotic features: Secondary | ICD-10-CM | POA: Insufficient documentation

## 2022-08-07 DIAGNOSIS — Z87891 Personal history of nicotine dependence: Secondary | ICD-10-CM | POA: Diagnosis not present

## 2022-08-07 DIAGNOSIS — D649 Anemia, unspecified: Secondary | ICD-10-CM | POA: Diagnosis present

## 2022-08-07 DIAGNOSIS — F332 Major depressive disorder, recurrent severe without psychotic features: Secondary | ICD-10-CM | POA: Diagnosis present

## 2022-08-07 LAB — CBC WITH DIFFERENTIAL/PLATELET
Abs Immature Granulocytes: 0.01 10*3/uL (ref 0.00–0.07)
Basophils Absolute: 0 10*3/uL (ref 0.0–0.1)
Basophils Relative: 1 %
Eosinophils Absolute: 0.2 10*3/uL (ref 0.0–0.5)
Eosinophils Relative: 3 %
HCT: 32.8 % — ABNORMAL LOW (ref 36.0–46.0)
Hemoglobin: 10.2 g/dL — ABNORMAL LOW (ref 12.0–15.0)
Immature Granulocytes: 0 %
Lymphocytes Relative: 38 %
Lymphs Abs: 2.4 10*3/uL (ref 0.7–4.0)
MCH: 26.7 pg (ref 26.0–34.0)
MCHC: 31.1 g/dL (ref 30.0–36.0)
MCV: 85.9 fL (ref 80.0–100.0)
Monocytes Absolute: 0.7 10*3/uL (ref 0.1–1.0)
Monocytes Relative: 11 %
Neutro Abs: 3 10*3/uL (ref 1.7–7.7)
Neutrophils Relative %: 47 %
Platelets: 319 10*3/uL (ref 150–400)
RBC: 3.82 MIL/uL — ABNORMAL LOW (ref 3.87–5.11)
RDW: 16.7 % — ABNORMAL HIGH (ref 11.5–15.5)
WBC: 6.3 10*3/uL (ref 4.0–10.5)
nRBC: 0 % (ref 0.0–0.2)

## 2022-08-07 LAB — POC URINE PREG, ED: Preg Test, Ur: NEGATIVE

## 2022-08-07 LAB — HEMOGLOBIN A1C
Hgb A1c MFr Bld: 4.8 % (ref 4.8–5.6)
Mean Plasma Glucose: 91.06 mg/dL

## 2022-08-07 LAB — POCT URINE DRUG SCREEN - MANUAL ENTRY (I-SCREEN)
POC Amphetamine UR: NOT DETECTED
POC Buprenorphine (BUP): NOT DETECTED
POC Cocaine UR: NOT DETECTED
POC Marijuana UR: POSITIVE — AB
POC Methadone UR: NOT DETECTED
POC Methamphetamine UR: NOT DETECTED
POC Morphine: NOT DETECTED
POC Oxazepam (BZO): NOT DETECTED
POC Oxycodone UR: NOT DETECTED
POC Secobarbital (BAR): NOT DETECTED

## 2022-08-07 LAB — COMPREHENSIVE METABOLIC PANEL
ALT: 15 U/L (ref 0–44)
AST: 17 U/L (ref 15–41)
Albumin: 3.8 g/dL (ref 3.5–5.0)
Alkaline Phosphatase: 43 U/L (ref 38–126)
Anion gap: 12 (ref 5–15)
BUN: 6 mg/dL (ref 6–20)
CO2: 26 mmol/L (ref 22–32)
Calcium: 9.3 mg/dL (ref 8.9–10.3)
Chloride: 104 mmol/L (ref 98–111)
Creatinine, Ser: 0.84 mg/dL (ref 0.44–1.00)
GFR, Estimated: >60 mL/min (ref >60)
Glucose, Bld: 100 mg/dL — ABNORMAL HIGH (ref 70–99)
Potassium: 3.7 mmol/L (ref 3.5–5.1)
Sodium: 142 mmol/L (ref 135–145)
Total Bilirubin: 0.4 mg/dL (ref 0.3–1.2)
Total Protein: 6.9 g/dL (ref 6.5–8.1)

## 2022-08-07 LAB — ETHANOL: Alcohol, Ethyl (B): <10 mg/dL (ref <10)

## 2022-08-07 LAB — TSH: TSH: 0.117 u[IU]/mL — ABNORMAL LOW (ref 0.350–4.500)

## 2022-08-07 LAB — LIPID PANEL
Cholesterol: 140 mg/dL (ref 0–200)
HDL: 71 mg/dL (ref >40)
LDL Cholesterol: 58 mg/dL (ref 0–99)
Total CHOL/HDL Ratio: 2 RATIO
Triglycerides: 54 mg/dL (ref <150)
VLDL: 11 mg/dL (ref 0–40)

## 2022-08-07 LAB — MAGNESIUM: Magnesium: 1.9 mg/dL (ref 1.7–2.4)

## 2022-08-07 LAB — HIV ANTIBODY (ROUTINE TESTING W REFLEX): HIV Screen 4th Generation wRfx: NONREACTIVE

## 2022-08-07 MED ORDER — ALUM & MAG HYDROXIDE-SIMETH 200-200-20 MG/5ML PO SUSP: 30.0000 mL | ORAL | Status: DC | PRN

## 2022-08-07 MED ORDER — HALOPERIDOL 5 MG PO TABS: 5.0000 mg | ORAL_TABLET | Freq: Three times a day (TID) | ORAL | Status: DC | PRN

## 2022-08-07 MED ORDER — DIPHENHYDRAMINE HCL 25 MG PO CAPS: 50.0000 mg | ORAL_CAPSULE | Freq: Three times a day (TID) | ORAL | Status: DC | PRN

## 2022-08-07 MED ORDER — SERTRALINE HCL 50 MG PO TABS: 50.0000 mg | ORAL_TABLET | Freq: Every day | ORAL | Status: DC

## 2022-08-07 MED ORDER — LORAZEPAM 1 MG PO TABS: 2.0000 mg | ORAL_TABLET | Freq: Three times a day (TID) | ORAL | Status: DC | PRN

## 2022-08-07 MED ORDER — TRAZODONE HCL 50 MG PO TABS
50.0000 mg | ORAL_TABLET | Freq: Every evening | ORAL | Status: DC | PRN
  Administered 2022-08-08 – 2022-08-10 (×3): 50 mg via ORAL
  Filled 2022-08-07 (×3): qty 1

## 2022-08-07 MED ORDER — HALOPERIDOL LACTATE 5 MG/ML IJ SOLN: 5.0000 mg | Freq: Three times a day (TID) | INTRAMUSCULAR | Status: DC | PRN

## 2022-08-07 MED ORDER — ACETAMINOPHEN 325 MG PO TABS: 650.0000 mg | ORAL_TABLET | Freq: Four times a day (QID) | ORAL | Status: DC | PRN

## 2022-08-07 MED ORDER — SERTRALINE HCL 50 MG PO TABS
50.0000 mg | ORAL_TABLET | Freq: Every day | ORAL | Status: DC
  Administered 2022-08-07: 50 mg via ORAL
  Filled 2022-08-07: qty 1

## 2022-08-07 MED ORDER — HYDROXYZINE HCL 25 MG PO TABS: 25.0000 mg | ORAL_TABLET | Freq: Three times a day (TID) | ORAL | Status: DC | PRN

## 2022-08-07 MED ORDER — TRAZODONE HCL 50 MG PO TABS: 50.0000 mg | ORAL_TABLET | Freq: Every evening | ORAL | Status: DC | PRN

## 2022-08-07 MED ORDER — DIPHENHYDRAMINE HCL 50 MG/ML IJ SOLN: 50.0000 mg | Freq: Three times a day (TID) | INTRAMUSCULAR | Status: DC | PRN

## 2022-08-07 MED ORDER — MAGNESIUM HYDROXIDE 400 MG/5ML PO SUSP: 30.0000 mL | Freq: Every day | ORAL | Status: DC | PRN

## 2022-08-07 MED ORDER — SERTRALINE HCL 50 MG PO TABS
50.0000 mg | ORAL_TABLET | Freq: Every day | ORAL | Status: DC
  Administered 2022-08-08 – 2022-08-11 (×4): 50 mg via ORAL
  Filled 2022-08-07 (×6): qty 1

## 2022-08-07 MED ORDER — LORAZEPAM 2 MG/ML IJ SOLN: 2.0000 mg | Freq: Three times a day (TID) | INTRAMUSCULAR | Status: DC | PRN

## 2022-08-07 NOTE — Discharge Instructions (Addendum)
Transfer patient to [facility] for inpatient psychiatric admission, Dr. Phineas Inches accepting MD

## 2022-08-07 NOTE — ED Notes (Signed)
Pt siting on her bed watching television. Alert and orient x 4.pt has flat affect. She is calm and cooperative. No c/o pain or distress. Will continue to monitor for safety

## 2022-08-07 NOTE — ED Notes (Addendum)
Attempted to draw blood, patient states she has not eaten or drank anything all day, water given and food given, will attempt on next shift.  Calm and cooperative, voluntary consent faxed to [facility]. Passive SI but contracts for safety on unit.

## 2022-08-07 NOTE — ED Notes (Signed)
Report was given to Jane RN@[facility] adult unit  
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
## 2022-08-07 NOTE — Progress Notes (Signed)
   08/07/22 1534  BHUC Triage Screening (Walk-ins at [facility] only)  How Did You Hear About [place]? Family/Friend  What Is the Reason for Your Visit/Call Today? Passive SI, worsening depression.  How Long Has This Been Causing You Problems? > than 6 months  Have You Recently Had Any Thoughts About Hurting Yourself? No  Are You Planning to Commit Suicide/Harm Yourself At This time? No  Have you Recently Had Thoughts About Hurting Someone [name]? No  Are You Planning To Harm Someone At This Time? No  Are you currently experiencing any auditory, visual or other hallucinations? No  Have You Used Any Alcohol or Drugs in the Past 24 Hours? No  Do you have any current medical co-morbidities that require immediate attention? No  Clinician description of patient physical appearance/behavior: tearful and depressed  What Do You Feel Would Help You the Most Today? Treatment for Depression or other mood problem  If access to [facility] was not available, would you have sought care in the Emergency Department? No  Determination of Need Urgent (48 hours)  Options For Referral Medication Management;Outpatient Therapy;Inpatient Hospitalization
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
## 2022-08-07 NOTE — Progress Notes (Signed)
Pt did not attend group. 

## 2022-08-08 ENCOUNTER — Encounter (HOSPITAL_COMMUNITY): Payer: Self-pay | Admitting: Psychiatry

## 2022-08-08 DIAGNOSIS — F411 Generalized anxiety disorder: Secondary | ICD-10-CM | POA: Insufficient documentation

## 2022-08-08 NOTE — Group Note (Signed)
Date:  08/08/2022 Time:  11:12 AM  Group Topic/Focus:  Developing a Wellness Toolbox:   The focus of this group is to help patients develop a "wellness toolbox" with skills and strategies to promote recovery upon discharge.    Participation Level:  Active  Participation Quality:  Appropriate  Affect:  Appropriate  Cognitive:  Alert  Insight: Appropriate  Engagement in Group:  Engaged  Modes of Intervention:  Activity  Additional Comments:  Pt participated in Social Wellness group. Today's lesson was Communication and pt participated in Communication Activity with other group members.   Latalia Etzler N Alethia Melendrez 08/08/2022, 11:12 AM  

## 2022-08-08 NOTE — Progress Notes (Signed)
Pt denied SI/HI/AVH this morning. Pt has been pleasant, calm, and cooperative throughout the shift.  RN provided support and encouragement to patient. Pt given scheduled medications as prescribed. Q15 min checks verified for safety. Patient verbally contracts for safety. Patient compliant with medications and treatment plan. Patient is interacting well on the unit. Pt is safe on the unit.   08/08/22 0900  Psych Admission Type (Psych Patients Only)  Admission Status Voluntary  Psychosocial Assessment  Patient Complaints None  Eye Contact Fair  Facial Expression Flat;Sad  Affect Flat;Sad  Speech Logical/coherent  Interaction Assertive  Motor Activity Slow  Appearance/Hygiene Unremarkable  Behavior Characteristics Cooperative;Appropriate to situation  Mood Depressed;Sad  Thought Process  Coherency WDL  Content WDL  Delusions None reported or observed  Perception WDL  Hallucination None reported or observed  Judgment Impaired  Confusion None  Danger to Self  Current suicidal ideation? Denies  Description of Suicide Plan No plan  Self-Injurious Behavior No self-injurious ideation or behavior indicators observed or expressed   Agreement Not to Harm Self Yes  Description of Agreement Pt verbally contracts for safety  Danger to Others  Danger to Others None reported or observed
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
## 2022-08-08 NOTE — [facility] Suicide Risk Assessment (Signed)
[facility] INPATIENT:  Family/Significant Other Suicide Prevention Education  Suicide Prevention Education:  Patient Refusal for Family/Significant Other Suicide Prevention Education: The patient Wendy Smith has refused to provide written consent for family/significant other to be provided Family/Significant Other Suicide Prevention Education during admission and/or prior to discharge.  Physician notified.  Corky Crafts 08/08/2022, 10:44 AM

## 2022-08-08 NOTE — Group Note (Signed)
Recreation Therapy Group Note   Group Topic:Animal Assisted Therapy   Group Date: 08/08/2022 Start Time: 0945 End Time: 1030 Facilitators: Shadee Rathod-McCall, LRT,CTRS Location: [address] Dayroom   Animal-Assisted Activity (AAA) Program Checklist/Progress Notes Patient Eligibility Criteria Checklist & Daily Group note for Rec Tx Intervention  AAA/T Program Assumption of Risk Form signed by Patient/ or Parent Legal Guardian Yes  Patient is free of allergies or severe asthma Yes  Patient reports no fear of animals Yes  Patient reports no history of cruelty to animals Yes  Patient understands his/her participation is voluntary Yes  Patient washes hands before animal contact Yes  Patient washes hands after animal contact Yes   Affect/Mood: Appropriate   Participation Level: Engaged   Participation Quality: Independent   Behavior: Appropriate   Speech/Thought Process: Focused   Insight: Good   Judgement: Good   Modes of Intervention: Teaching laboratory technician   Patient Response to Interventions:  Engaged   Education Outcome:  Acknowledges education   Clinical Observations/Individualized Feedback:     Plan: Continue to engage patient in RT group sessions 2-3x/week.   Wendy Smith, LRT,CTRS 08/08/2022 1:15 PM
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
## 2022-08-08 NOTE — Tx Team (Signed)
Initial Treatment Plan 08/08/2022 12:48 AM Wendy Smith ZOX:[id number]    PATIENT STRESSORS: Financial difficulties   Medication change or noncompliance   Substance abuse   Traumatic event     PATIENT STRENGTHS: Physical Health  Supportive family/friends  Work skills    PATIENT IDENTIFIED PROBLEMS: Depression  Anxiety  "To be able to express myself more"                 DISCHARGE CRITERIA:  Ability to meet basic life and health needs Improved stabilization in mood, thinking, and/or behavior Medical problems require only outpatient monitoring Verbal commitment to aftercare and medication compliance  PRELIMINARY DISCHARGE PLAN: Attend aftercare/continuing care group Attend PHP/IOP Outpatient therapy Return to previous living arrangement Return to previous work or school arrangements  PATIENT/FAMILY INVOLVEMENT: This treatment plan has been presented to and reviewed with the patient, Wendy Smith, and/or family member.  The patient and family have been given the opportunity to ask questions and make suggestions.  Bethann Punches, RN 08/08/2022, 12:48 AM
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
## 2022-08-08 NOTE — Group Note (Signed)
Date:  08/08/2022 Time:  10:06 AM  Group Topic/Focus:  Goals Group:   The focus of this group is to help patients establish daily goals to achieve during treatment and discuss how the patient can incorporate goal setting into their daily lives to aide in recovery.    Participation Level:  Active  Participation Quality:  Appropriate  Affect:  Appropriate  Cognitive:  Appropriate  Insight: Appropriate  Engagement in Group:  Engaged  Modes of Intervention:  Discussion  Additional Comments:  Pt stated that her goal for today was to talk to her daughter.   Beckie Busing 08/08/2022, 10:06 AM

## 2022-08-08 NOTE — Progress Notes (Signed)
Adult Psychoeducational Group Note  Date:  08/08/2022 Time:  8:32 PM  Group Topic/Focus:  Wrap-Up Group:   The focus of this group is to help patients review their daily goal of treatment and discuss progress on daily workbooks.  Participation Level:  Active  Participation Quality:  Appropriate  Affect:  Appropriate  Cognitive:  Appropriate  Insight: Appropriate  Engagement in Group:  Engaged  Modes of Intervention:  Discussion  Additional Comments:  [place] said the color that describes her overall day is blue  Charna Busman Long 08/08/2022, 8:32 PM

## 2022-08-09 ENCOUNTER — Encounter (HOSPITAL_COMMUNITY): Payer: Self-pay

## 2022-08-09 LAB — T4, FREE: Free T4: 0.69 ng/dL (ref 0.61–1.12)

## 2022-08-09 LAB — TSH: TSH: 0.252 u[IU]/mL — ABNORMAL LOW (ref 0.350–4.500)

## 2022-08-09 NOTE — [facility] Group Notes (Signed)
[facility] Group Notes:  (Nursing/MHT/Case Management/Adjunct)  Date:  08/09/2022  Time:  9:00 PM  Type of Therapy:   NA group  Participation Level:  Active  Participation Quality:  Appropriate  Affect:  Appropriate  Cognitive:  Appropriate  Insight:  Appropriate  Engagement in Group:  Engaged  Modes of Intervention:  Education  Summary of Progress/Problems: Attended NA meeting.  Wendy Smith 08/09/2022, 9:00 PM

## 2022-08-09 NOTE — [facility] Group Notes (Signed)
Spiritual care group on grief and loss facilitated by Chaplain Dyanne Carrel, [facility]  Group Goal: Support / Education around grief and loss  Members engage in facilitated group support and psycho-social education.  Group Description:  Following introductions and group rules, group members engaged in facilitated group dialogue and support around topic of loss, with particular support around experiences of loss in their lives. Group Identified types of loss (relationships / self / things) and identified patterns, circumstances, and changes that precipitate losses. Reflected on thoughts / feelings around loss, normalized grief responses, and recognized variety in grief experience. Group encouraged individual reflection on safe space and on the coping skills that they are already utilizing.  Group drew on Adlerian / Rogerian and narrative framework  Patient Progress: Wendy Smith attended group and actively engaged in the group conversation and activities. She shared about the loss of her sister and spoke of her own grief experiences.  She was supportive of peers as they shared.

## 2022-08-09 NOTE — Progress Notes (Addendum)
Pt denied SI/HI/AVH this morning. Pt rated her depression a 0/10, anxiety a 0/10, and feelings of hopelessness a 0/10. Pt reports that she slept "good" last night. Pt reports that the PRN Trazadone worked well for sleep. Pt has been pleasant, calm, and cooperative throughout the shift. RN provided support and encouragement to patient. Pt given scheduled medications as prescribed. Q15 min checks verified for safety. Patient verbally contracts for safety. Patient compliant with medications and treatment plan. Patient is interacting well on the unit. Pt is safe on the unit.   08/09/22 0900  Psych Admission Type (Psych Patients Only)  Admission Status Voluntary  Psychosocial Assessment  Patient Complaints None  Eye Contact Fair  Facial Expression Sad;Flat  Affect Sad  Speech Logical/coherent  Interaction Assertive  Motor Activity Slow  Appearance/Hygiene Unremarkable  Behavior Characteristics Cooperative  Mood Sad  Thought Process  Coherency WDL  Content WDL  Delusions None reported or observed  Perception WDL  Hallucination None reported or observed  Judgment Impaired  Confusion None  Danger to Self  Current suicidal ideation? Denies  Description of Suicide Plan No plan  Self-Injurious Behavior No self-injurious ideation or behavior indicators observed or expressed   Agreement Not to Harm Self Yes  Description of Agreement Pt verbally contracts for safety  Danger to Others  Danger to Others None reported or observed
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
## 2022-08-09 NOTE — BH IP Treatment Plan (Signed)
Interdisciplinary Treatment and Diagnostic Plan Update  08/09/2022 Time of Session: 11:05 AM Wendy Smith MRN: [id number]  Principal Diagnosis: MDD (major depressive disorder), severe (HCC)  Secondary Diagnoses: Principal Problem:   MDD (major depressive disorder), severe (HCC) Active Problems:   GAD (generalized anxiety disorder)   Current Medications:  Current Facility-Administered Medications  Medication Dose Route Frequency Provider Last Rate Last Admin   acetaminophen (TYLENOL) tablet 650 mg  650 mg Oral Q6H PRN Ardis Hughs, NP       alum & mag hydroxide-simeth (MAALOX/MYLANTA) 200-200-20 MG/5ML suspension 30 mL  30 mL Oral Q4H PRN Ardis Hughs, NP       diphenhydrAMINE (BENADRYL) capsule 50 mg  50 mg Oral TID PRN Ardis Hughs, NP       Or   diphenhydrAMINE (BENADRYL) injection 50 mg  50 mg Intramuscular TID PRN Ardis Hughs, NP       haloperidol (HALDOL) tablet 5 mg  5 mg Oral TID PRN Ardis Hughs, NP       Or   haloperidol lactate (HALDOL) injection 5 mg  5 mg Intramuscular TID PRN Ardis Hughs, NP       hydrOXYzine (ATARAX) tablet 25 mg  25 mg Oral TID PRN Ardis Hughs, NP       LORazepam (ATIVAN) tablet 2 mg  2 mg Oral TID PRN Ardis Hughs, NP       Or   LORazepam (ATIVAN) injection 2 mg  2 mg Intramuscular TID PRN Ardis Hughs, NP       magnesium hydroxide (MILK OF MAGNESIA) suspension 30 mL  30 mL Oral Daily PRN Ardis Hughs, NP       sertraline (ZOLOFT) tablet 50 mg  50 mg Oral Daily Ardis Hughs, NP   50 mg at 08/09/22 0748   traZODone (DESYREL) tablet 50 mg  50 mg Oral QHS PRN Ardis Hughs, NP   50 mg at 08/08/22 2125   PTA Medications: No medications prior to admission.    Patient Stressors: Financial difficulties   Medication change or noncompliance   Substance abuse   Traumatic event    Patient Strengths: Physical Health  Supportive family/friends  Work skills   Treatment Modalities:  Medication Management, Group therapy, Case management,  1 to 1 session with clinician, Psychoeducation, Recreational therapy.   Physician Treatment Plan for Primary Diagnosis: MDD (major depressive disorder), severe (HCC) Long Term Goal(s):     Short Term Goals:    Medication Management: Evaluate patient's response, side effects, and tolerance of medication regimen.  Therapeutic Interventions: 1 to 1 sessions, Unit Group sessions and Medication administration.  Evaluation of Outcomes: Not Progressing  Physician Treatment Plan for Secondary Diagnosis: Principal Problem:   MDD (major depressive disorder), severe (HCC) Active Problems:   GAD (generalized anxiety disorder)  Long Term Goal(s):     Short Term Goals:       Medication Management: Evaluate patient's response, side effects, and tolerance of medication regimen.  Therapeutic Interventions: 1 to 1 sessions, Unit Group sessions and Medication administration.  Evaluation of Outcomes: Not Progressing   RN Treatment Plan for Primary Diagnosis: MDD (major depressive disorder), severe (HCC) Long Term Goal(s): Knowledge of disease and therapeutic regimen to maintain health will improve  Short Term Goals: Ability to remain free from injury will improve, Ability to verbalize frustration and anger appropriately will improve, Ability to demonstrate self-control, Ability to participate in decision making will improve, Ability to verbalize  feelings will improve, Ability to disclose and discuss suicidal ideas, Ability to identify and develop effective coping behaviors will improve, and Compliance with prescribed medications will improve  Medication Management: RN will administer medications as ordered by provider, will assess and evaluate patient's response and provide education to patient for prescribed medication. RN will report any adverse and/or side effects to prescribing provider.  Therapeutic Interventions: 1 on 1 counseling  sessions, Psychoeducation, Medication administration, Evaluate responses to treatment, Monitor vital signs and CBGs as ordered, Perform/monitor CIWA, COWS, AIMS and Fall Risk screenings as ordered, Perform wound care treatments as ordered.  Evaluation of Outcomes: Not Progressing   LCSW Treatment Plan for Primary Diagnosis: MDD (major depressive disorder), severe (HCC) Long Term Goal(s): Safe transition to appropriate next level of care at discharge, Engage patient in therapeutic group addressing interpersonal concerns.  Short Term Goals: Engage patient in aftercare planning with referrals and resources, Increase social support, Increase ability to appropriately verbalize feelings, Increase emotional regulation, Facilitate acceptance of mental health diagnosis and concerns, Facilitate patient progression through stages of change regarding substance use diagnoses and concerns, Identify triggers associated with mental health/substance abuse issues, and Increase skills for wellness and recovery  Therapeutic Interventions: Assess for all discharge needs, 1 to 1 time with Social worker, Explore available resources and support systems, Assess for adequacy in community support network, Educate family and significant other(s) on suicide prevention, Complete Psychosocial Assessment, Interpersonal group therapy.  Evaluation of Outcomes: Not Progressing   Progress in Treatment: Attending groups: Yes. Participating in groups: Yes. Taking medication as prescribed: Yes. Toleration medication: Yes. Family/Significant other contact made: No, will contact:  Patient declined  Patient understands diagnosis: Yes. Discussing patient identified problems/goals with staff: Yes. Medical problems stabilized or resolved: Yes. Denies suicidal/homicidal ideation: Yes. Issues/concerns per patient self-inventory: No.   New problem(s) identified: No, Describe:  None reported   New Short Term/Long Term  Goal(s):medication stabilization, elimination of SI thoughts, development of comprehensive mental wellness plan.    Patient Goals:  " Get better, communicate more, stop being a crybaby, and have resources to help me find a house"   Discharge Plan or Barriers: Patient recently admitted. CSW will continue to follow and assess for appropriate referrals and possible discharge planning.    Reason for Continuation of Hospitalization: Anxiety Depression Medication stabilization Other; describe Feeling of being overwhelmed   Estimated Length of Stay: 3-5 days   Last 3 [place] Suicide Severity Risk Score: Flowsheet Row [facility] (Current[facility] Most recent reading at 08/08/2022 10:54 AM ED from 08/07/2022 in [facility] Most recent reading at 08/07/2022  3:53 PM  C-SSRS RISK CATEGORY No Risk Low Risk       Last PHQ 2/9 Scores:    01/14/2020    8:23 AM 09/24/2019    9:31 AM  Depression screen PHQ 2/9  Decreased Interest 0 0  Down, Depressed, Hopeless 0 0  PHQ - 2 Score 0 0  Altered sleeping 0 0  Tired, decreased energy 0 0  Change in appetite 0 1  Feeling bad or failure about yourself  0 0  Trouble concentrating 0 0  Moving slowly or fidgety/restless 0 0  Suicidal thoughts 0 0  PHQ-9 Score 0 1  Difficult doing work/chores Not difficult at all Not difficult at all    Scribe for Treatment Team: Isabella Bowens, LCSWA 08/09/2022 11:35 AM

## 2022-08-10 LAB — T3, FREE: T3, Free: 3.2 pg/mL (ref 2.0–4.4)
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
## 2022-08-10 NOTE — Group Note (Signed)
Date:  08/10/2022 Time:  10:44 AM  Group Topic/Focus:  Goals Group:   The focus of this group is to help patients establish daily goals to achieve during treatment and discuss how the patient can incorporate goal setting into their daily lives to aide in recovery. Orientation:   The focus of this group is to educate the patient on the purpose and policies of crisis stabilization and provide a format to answer questions about their admission.  The group details unit policies and expectations of patients while admitted.    Participation Level:  Active  Participation Quality:  Attentive  Affect:  Appropriate  Cognitive:  Appropriate  Insight: Appropriate  Engagement in Group:  Engaged  Modes of Intervention:  Discussion  Additional Comments:  Patient attended goals group and was attentive the duration of it. Patient's goal was to come up with a discharge plan.   [name] T Lorraine Lax 08/10/2022, 10:44 AM

## 2022-08-10 NOTE — Progress Notes (Signed)
Pt denied SI/HI/AVH this morning. Pt rated her depression a 0/10, anxiety a 0/10, and feelings of hopelessness a 0/10. Pt reports she slept "good" last night. Pt has been pleasant, calm, and cooperative throughout the shift. RN provided support and encouragement to patient. Pt given scheduled medications as prescribed. Q15 min checks verified for safety. Patient verbally contracts for safety. Patient compliant with medications and treatment plan. Patient is interacting well on the unit. Pt is safe on the unit.   08/10/22 0900  Psych Admission Type (Psych Patients Only)  Admission Status Voluntary  Psychosocial Assessment  Patient Complaints None  Eye Contact Fair  Facial Expression Sad  Affect Appropriate to circumstance  Speech Logical/coherent  Interaction Assertive  Motor Activity Other (Comment) (WDL)  Appearance/Hygiene Unremarkable  Behavior Characteristics Cooperative;Appropriate to situation  Mood Pleasant  Thought Process  Coherency WDL  Content WDL  Delusions None reported or observed  Perception WDL  Hallucination None reported or observed  Judgment WDL  Confusion None  Danger to Self  Current suicidal ideation? Denies  Description of Suicide Plan No plan  Self-Injurious Behavior No self-injurious ideation or behavior indicators observed or expressed   Agreement Not to Harm Self Yes  Description of Agreement Pt verbally contracts for safety  Danger to Others  Danger to Others None reported or observed

## 2022-08-10 NOTE — Plan of Care (Signed)
  Problem: Education: Goal: Emotional status will improve Outcome: Progressing Goal: Mental status will improve Outcome: Progressing   Problem: Activity: Goal: Interest or engagement in activities will improve Outcome: Progressing   Problem: Coping: Goal: Ability to demonstrate self-control will improve Outcome: Progressing   Problem: Coping: Goal: Coping ability will improve Outcome: Progressing   Problem: Self-Concept: Goal: Will verbalize positive feelings about self Outcome: Progressing

## 2022-08-10 NOTE — Progress Notes (Signed)
Chaplain followed up with Wendy Smith who had requested to meet today as a follow up to grief and loss group.  Wendy Smith shared that she was doing okay and that her main concern is housing.  She is currently living with her daughter's paternal grandmother.  This has been very helpful to her, but she is wanting to be independent and find a place for herself and her daughter.  Chaplain provided listening and recommended that she speak with her social worker to see if she might have some resources for housing.  [address], Bcc Pager, [phone]

## 2022-08-10 NOTE — Progress Notes (Signed)
   08/10/22 2100  Psych Admission Type (Psych Patients Only)  Admission Status Voluntary  Psychosocial Assessment  Patient Complaints None  Eye Contact Fair  Facial Expression Anxious  Affect Appropriate to circumstance  Speech Logical/coherent  Interaction Assertive  Motor Activity Other (Comment) (WDL)  Appearance/Hygiene Unremarkable  Behavior Characteristics Cooperative;Appropriate to situation  Mood Pleasant  Thought Process  Coherency WDL  Content WDL  Delusions None reported or observed  Perception WDL  Hallucination None reported or observed  Judgment WDL  Confusion None  Danger to Self  Current suicidal ideation? Denies  Self-Injurious Behavior No self-injurious ideation or behavior indicators observed or expressed   Agreement Not to Harm Self Yes  Description of Agreement verbal  Danger to Others  Danger to Others None reported or observed

## 2022-08-10 NOTE — [facility] Group Notes (Signed)
[facility] Group Notes:  (Nursing/MHT/Case Management/Adjunct)  Date:  08/10/2022  Time:  9:32 PM  Type of Therapy:   Wrap-up group  Participation Level:  Active  Participation Quality:  Appropriate  Affect:  Appropriate  Cognitive:  Appropriate  Insight:  Appropriate  Engagement in Group:  Engaged  Modes of Intervention:  Education  Summary of Progress/Problems: Goal to make others laugh. Day 7/10.  Wendy Smith 08/10/2022, 9:32 PM

## 2022-08-10 NOTE — Progress Notes (Signed)
   08/10/22 0600  15 Minute Checks  Location Bedroom  Visual Appearance Calm  Behavior Sleeping  Sleep (Behavioral Health Patients Only)  Calculate sleep? (Click Yes once per 24 hr at 0600 safety check) Yes  Documented sleep last 24 hours 7.75

## 2022-08-11 DIAGNOSIS — F322 Major depressive disorder, single episode, severe without psychotic features: Secondary | ICD-10-CM

## 2022-08-11 MED ORDER — TRAZODONE HCL 50 MG PO TABS: 50.0000 mg | ORAL_TABLET | Freq: Every evening | ORAL | 0 refills | Status: AC | PRN

## 2022-08-11 MED ORDER — SERTRALINE HCL 50 MG PO TABS: 50.0000 mg | ORAL_TABLET | Freq: Every day | ORAL | 0 refills | Status: AC

## 2022-08-11 NOTE — Progress Notes (Signed)
Discharge:  Patient discharged home.  Denied SI and HI.  Denied A/V hallucinations.  Patient stated she received all her belongings, clothing, toiletries, misc items, etc.  Patient stated she appreciated all assistance received from [facility] staff.  All required discharge information given.

## 2022-08-11 NOTE — Discharge Instructions (Signed)
-  Follow-up with your outpatient psychiatric provider -instructions on appointment date, time, and address (location) are provided to you in discharge paperwork.  -Take your psychiatric medications as prescribed at discharge - instructions are provided to you in the discharge paperwork  -Follow-up with outpatient primary care doctor and other specialists -for management of preventative medicine and any chronic medical disease.  -Recommend abstinence from alcohol, tobacco, and other illicit drug use at discharge.   -If your psychiatric symptoms recur, worsen, or if you have side effects to your psychiatric medications, call your outpatient psychiatric provider, 911, 988 or go to the nearest emergency department.  -If suicidal thoughts occur, call your outpatient psychiatric provider, 911, 988 or go to the nearest emergency department.  Naloxone (Narcan) can help reverse an overdose when given to the victim quickly.  [place] offers free naloxone kits and instructions/training on its use.  Add naloxone to your first aid kit and you can help save a life.   Pick up your free kit at the following locations:   [place]:  [facility], [address] [place] [address] ([phone]) [facility] [address] [place] [id number] ([phone]) [facility] [address] [place] [address]  [place]: [facility] [address] [place] [address] ([phone]) [facility] [address] [place] [address] ([phone])  
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
## 2022-08-11 NOTE — Progress Notes (Signed)
  [facility] Adult Case Management Discharge Plan :  Will you be returning to the same living situation after discharge:  Yes,  Pt declined consents At discharge, do you have transportation home?: Yes,  Family Member Do you have the ability to pay for your medications: Yes,  Insured  Release of information consent forms completed and in the chart;  Patient's signature needed at discharge.  Patient to Follow up at:  Follow-up Information     [facility] Go on 08/14/2022.   Why: Please arrive approximately 15 minutes early prior to you appointment @ 9:45am. Medication Management and Counseling Contact information: [address] [place] [address] [phone]                 Next level of care provider has access to [facility] Link:yes  Safety Planning and Suicide Prevention discussed: No.Declined Consent     Has patient been referred to the Quitline?: Patient refused referral for treatment  Patient has been referred for addiction treatment: Yes, the patient will follow up with an outpatient provider for substance use disorder. Psychiatrist/APP: appointment made and Therapist: appointment made Patient to continue working towards treatment goals after discharge. Patient no longer meets criteria for inpatient criteria per attending physician. Continue taking medications as prescribed, nursing to provide instructions at discharge. Follow up with all scheduled appointments.   Hamid Brookens S Dakwon Wenberg, LCSW 08/11/2022, 9:29 AM

## 2022-08-11 NOTE — [facility] Group Notes (Signed)
The focus of this group is to help patients establish daily goals to achieve during treatment and discuss how the patient can incorporate goal setting into their daily lives to aide in recovery.    10 out of 10  Goal: discharge

## 2022-08-11 NOTE — Group Note (Signed)
Recreation Therapy Group Note   Group Topic:Leisure Education  Group Date: 08/11/2022 Start Time: 0935 End Time: 1009 Facilitators: Rhodesia Stanger-McCall, LRT,CTRS Location: [address] Dayroom   Goal Area(s) Addresses:  Patient will identify positive leisure and recreation activities.  Patient will identify one positive benefit of participation in leisure activities.   Group Description: Leisure Charades. Patients were divided in to 2 groups for game play. LRT used small strips of paper with 1 listed leisure or recreation activity. Patients took turns randomly drawing a slip of paper and acting out the identified activity without using words or sounds. Points were awarded to the team with the first correct guess. After several rounds of game play, team with the most points were declared the winners.   Affect/Mood: Appropriate   Participation Level: Engaged   Participation Quality: Independent   Behavior: Appropriate   Speech/Thought Process: Focused   Insight: Good   Judgement: Good   Modes of Intervention: Competitive Play   Patient Response to Interventions:  Engaged   Education Outcome:  Acknowledges education   Clinical Observations/Individualized Feedback: Pt attended and participated in group.     Plan: Continue to engage patient in RT group sessions 2-3x/week.   Jann Ra-McCall, LRT,CTRS  08/11/2022 12:07 PM

## 2022-08-11 NOTE — Plan of Care (Signed)
Nurse discussed anxiety, depression and coping skills with patient.  
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
## 2022-08-11 NOTE — Progress Notes (Signed)
   08/11/22 0530  15 Minute Checks  Location Bedroom  Visual Appearance Calm  Behavior Sleeping  Sleep (Behavioral Health Patients Only)  Calculate sleep? (Click Yes once per 24 hr at 0600 safety check) Yes  Documented sleep last 24 hours 7.75    

## 2022-08-11 NOTE — Progress Notes (Signed)
   On assessment, patient presents with a request for help with sleep.  Administered PRN Trazodone per [facility] per patient request.
